# Patient Record
Sex: Female | Born: 1950
Health system: Southern US, Community
[De-identification: ages and names within clinical notes are randomized; demographics above are authoritative.]

## PROBLEM LIST (undated history)

## (undated) DIAGNOSIS — F32A Depression, unspecified: Secondary | ICD-10-CM

## (undated) DIAGNOSIS — E785 Hyperlipidemia, unspecified: Secondary | ICD-10-CM

## (undated) DIAGNOSIS — T4145XA Adverse effect of unspecified anesthetic, initial encounter: Secondary | ICD-10-CM

## (undated) DIAGNOSIS — F329 Major depressive disorder, single episode, unspecified: Secondary | ICD-10-CM

## (undated) DIAGNOSIS — T8859XA Other complications of anesthesia, initial encounter: Secondary | ICD-10-CM

## (undated) DIAGNOSIS — R112 Nausea with vomiting, unspecified: Secondary | ICD-10-CM

## (undated) DIAGNOSIS — I10 Essential (primary) hypertension: Secondary | ICD-10-CM

## (undated) DIAGNOSIS — Z9889 Other specified postprocedural states: Secondary | ICD-10-CM

## (undated) DIAGNOSIS — F419 Anxiety disorder, unspecified: Secondary | ICD-10-CM

## (undated) HISTORY — PX: BREAST BIOPSY: SHX20

## (undated) HISTORY — PX: ABDOMINAL HYSTERECTOMY: SHX81

---

## 2002-12-18 ENCOUNTER — Emergency Department (HOSPITAL_COMMUNITY): Admission: EM | Admit: 2002-12-18 | Discharge: 2002-12-18 | Payer: Self-pay | Admitting: Emergency Medicine

## 2002-12-18 ENCOUNTER — Encounter: Payer: Self-pay | Admitting: Emergency Medicine

## 2002-12-28 ENCOUNTER — Ambulatory Visit: Admission: RE | Admit: 2002-12-28 | Discharge: 2002-12-28 | Payer: Self-pay | Admitting: Gynecology

## 2002-12-30 ENCOUNTER — Encounter: Payer: Self-pay | Admitting: Gynecology

## 2003-01-04 ENCOUNTER — Inpatient Hospital Stay (HOSPITAL_COMMUNITY): Admission: RE | Admit: 2003-01-04 | Discharge: 2003-01-07 | Payer: Self-pay | Admitting: Obstetrics & Gynecology

## 2003-02-16 ENCOUNTER — Ambulatory Visit: Admission: RE | Admit: 2003-02-16 | Discharge: 2003-02-16 | Payer: Self-pay | Admitting: Gynecology

## 2003-04-04 ENCOUNTER — Other Ambulatory Visit: Admission: RE | Admit: 2003-04-04 | Discharge: 2003-04-04 | Payer: Self-pay | Admitting: Dermatology

## 2016-06-11 DIAGNOSIS — I10 Essential (primary) hypertension: Secondary | ICD-10-CM | POA: Diagnosis not present

## 2016-06-11 DIAGNOSIS — Z6837 Body mass index (BMI) 37.0-37.9, adult: Secondary | ICD-10-CM | POA: Diagnosis not present

## 2016-06-11 DIAGNOSIS — F419 Anxiety disorder, unspecified: Secondary | ICD-10-CM | POA: Diagnosis not present

## 2016-06-14 ENCOUNTER — Other Ambulatory Visit (HOSPITAL_COMMUNITY): Payer: Self-pay | Admitting: Internal Medicine

## 2016-06-14 DIAGNOSIS — Z1231 Encounter for screening mammogram for malignant neoplasm of breast: Secondary | ICD-10-CM

## 2016-06-24 ENCOUNTER — Encounter (HOSPITAL_COMMUNITY): Payer: Self-pay | Admitting: Radiology

## 2016-06-24 ENCOUNTER — Ambulatory Visit (HOSPITAL_COMMUNITY)
Admission: RE | Admit: 2016-06-24 | Discharge: 2016-06-24 | Disposition: A | Discharge status: Home / Self Care | Payer: Medicare Other | Source: Ambulatory Visit | Attending: Internal Medicine | Admitting: Internal Medicine

## 2016-06-24 DIAGNOSIS — Z1231 Encounter for screening mammogram for malignant neoplasm of breast: Secondary | ICD-10-CM | POA: Insufficient documentation

## 2016-06-25 ENCOUNTER — Other Ambulatory Visit: Payer: Self-pay | Admitting: Internal Medicine

## 2016-06-25 DIAGNOSIS — R928 Other abnormal and inconclusive findings on diagnostic imaging of breast: Secondary | ICD-10-CM

## 2016-07-09 ENCOUNTER — Ambulatory Visit (HOSPITAL_COMMUNITY)
Admission: RE | Admit: 2016-07-09 | Discharge: 2016-07-09 | Disposition: A | Discharge status: Home / Self Care | Payer: Medicare Other | Source: Ambulatory Visit | Attending: Internal Medicine | Admitting: Internal Medicine

## 2016-07-09 DIAGNOSIS — R928 Other abnormal and inconclusive findings on diagnostic imaging of breast: Secondary | ICD-10-CM

## 2016-07-09 DIAGNOSIS — N6310 Unspecified lump in the right breast, unspecified quadrant: Secondary | ICD-10-CM | POA: Diagnosis not present

## 2016-07-09 DIAGNOSIS — N6489 Other specified disorders of breast: Secondary | ICD-10-CM | POA: Diagnosis not present

## 2016-07-11 ENCOUNTER — Other Ambulatory Visit (HOSPITAL_COMMUNITY): Payer: Self-pay | Admitting: Internal Medicine

## 2016-07-11 DIAGNOSIS — N6310 Unspecified lump in the right breast, unspecified quadrant: Secondary | ICD-10-CM

## 2016-07-15 DIAGNOSIS — I10 Essential (primary) hypertension: Secondary | ICD-10-CM | POA: Diagnosis not present

## 2016-07-15 DIAGNOSIS — Z79899 Other long term (current) drug therapy: Secondary | ICD-10-CM | POA: Diagnosis not present

## 2016-07-15 DIAGNOSIS — F419 Anxiety disorder, unspecified: Secondary | ICD-10-CM | POA: Diagnosis not present

## 2016-07-22 DIAGNOSIS — R7301 Impaired fasting glucose: Secondary | ICD-10-CM | POA: Diagnosis not present

## 2016-07-22 DIAGNOSIS — Z23 Encounter for immunization: Secondary | ICD-10-CM | POA: Diagnosis not present

## 2016-07-22 DIAGNOSIS — I10 Essential (primary) hypertension: Secondary | ICD-10-CM | POA: Diagnosis not present

## 2016-07-22 DIAGNOSIS — E785 Hyperlipidemia, unspecified: Secondary | ICD-10-CM | POA: Diagnosis not present

## 2016-07-23 ENCOUNTER — Other Ambulatory Visit (HOSPITAL_COMMUNITY): Payer: Self-pay | Admitting: Internal Medicine

## 2016-07-23 ENCOUNTER — Ambulatory Visit (HOSPITAL_COMMUNITY)
Admission: RE | Admit: 2016-07-23 | Discharge: 2016-07-23 | Disposition: A | Discharge status: Home / Self Care | Payer: Medicare Other | Source: Ambulatory Visit | Attending: Internal Medicine | Admitting: Internal Medicine

## 2016-07-23 ENCOUNTER — Encounter (HOSPITAL_COMMUNITY): Payer: Self-pay

## 2016-07-23 ENCOUNTER — Ambulatory Visit (HOSPITAL_COMMUNITY): Payer: Medicare Other

## 2016-07-23 DIAGNOSIS — R920 Mammographic microcalcification found on diagnostic imaging of breast: Secondary | ICD-10-CM | POA: Insufficient documentation

## 2016-07-23 DIAGNOSIS — N6311 Unspecified lump in the right breast, upper outer quadrant: Secondary | ICD-10-CM | POA: Diagnosis not present

## 2016-07-23 DIAGNOSIS — N6310 Unspecified lump in the right breast, unspecified quadrant: Secondary | ICD-10-CM

## 2016-07-23 DIAGNOSIS — D241 Benign neoplasm of right breast: Secondary | ICD-10-CM | POA: Diagnosis not present

## 2016-07-23 MED ORDER — LIDOCAINE-EPINEPHRINE (PF) 1 %-1:200000 IJ SOLN
INTRAMUSCULAR | Status: AC
  Filled 2016-07-23: qty 30

## 2016-07-23 MED ORDER — LIDOCAINE HCL (PF) 1 % IJ SOLN
INTRAMUSCULAR | Status: AC
  Filled 2016-07-23: qty 10

## 2016-08-26 DIAGNOSIS — Z79899 Other long term (current) drug therapy: Secondary | ICD-10-CM | POA: Diagnosis not present

## 2016-08-26 DIAGNOSIS — E785 Hyperlipidemia, unspecified: Secondary | ICD-10-CM | POA: Diagnosis not present

## 2016-09-02 DIAGNOSIS — E785 Hyperlipidemia, unspecified: Secondary | ICD-10-CM | POA: Diagnosis not present

## 2016-09-02 DIAGNOSIS — I10 Essential (primary) hypertension: Secondary | ICD-10-CM | POA: Diagnosis not present

## 2016-09-02 DIAGNOSIS — Z23 Encounter for immunization: Secondary | ICD-10-CM | POA: Diagnosis not present

## 2016-09-05 ENCOUNTER — Encounter (INDEPENDENT_AMBULATORY_CARE_PROVIDER_SITE_OTHER): Payer: Self-pay | Admitting: *Deleted

## 2016-09-16 DIAGNOSIS — M6740 Ganglion, unspecified site: Secondary | ICD-10-CM | POA: Diagnosis not present

## 2016-09-16 DIAGNOSIS — M67442 Ganglion, left hand: Secondary | ICD-10-CM | POA: Diagnosis not present

## 2016-09-16 DIAGNOSIS — M19042 Primary osteoarthritis, left hand: Secondary | ICD-10-CM | POA: Diagnosis not present

## 2016-10-15 ENCOUNTER — Encounter (HOSPITAL_BASED_OUTPATIENT_CLINIC_OR_DEPARTMENT_OTHER): Payer: Self-pay | Admitting: *Deleted

## 2016-10-17 ENCOUNTER — Other Ambulatory Visit: Payer: Self-pay | Admitting: Orthopedic Surgery

## 2016-10-22 ENCOUNTER — Ambulatory Visit (HOSPITAL_BASED_OUTPATIENT_CLINIC_OR_DEPARTMENT_OTHER)
Admission: RE | Admit: 2016-10-22 | Discharge: 2016-10-22 | Disposition: A | Discharge status: Home / Self Care | Payer: Medicare Other | Source: Ambulatory Visit | Attending: Orthopedic Surgery | Admitting: Orthopedic Surgery

## 2016-10-22 ENCOUNTER — Ambulatory Visit (HOSPITAL_BASED_OUTPATIENT_CLINIC_OR_DEPARTMENT_OTHER): Payer: Medicare Other | Admitting: Anesthesiology

## 2016-10-22 ENCOUNTER — Encounter (HOSPITAL_BASED_OUTPATIENT_CLINIC_OR_DEPARTMENT_OTHER)
Admission: RE | Disposition: A | Discharge status: Home / Self Care | Payer: Self-pay | Source: Ambulatory Visit | Attending: Orthopedic Surgery

## 2016-10-22 ENCOUNTER — Encounter (HOSPITAL_BASED_OUTPATIENT_CLINIC_OR_DEPARTMENT_OTHER): Payer: Self-pay | Admitting: Anesthesiology

## 2016-10-22 DIAGNOSIS — Z79899 Other long term (current) drug therapy: Secondary | ICD-10-CM | POA: Insufficient documentation

## 2016-10-22 DIAGNOSIS — E785 Hyperlipidemia, unspecified: Secondary | ICD-10-CM | POA: Insufficient documentation

## 2016-10-22 DIAGNOSIS — I10 Essential (primary) hypertension: Secondary | ICD-10-CM | POA: Diagnosis not present

## 2016-10-22 DIAGNOSIS — M71342 Other bursal cyst, left hand: Secondary | ICD-10-CM | POA: Diagnosis not present

## 2016-10-22 DIAGNOSIS — M25842 Other specified joint disorders, left hand: Secondary | ICD-10-CM | POA: Insufficient documentation

## 2016-10-22 DIAGNOSIS — F419 Anxiety disorder, unspecified: Secondary | ICD-10-CM | POA: Diagnosis not present

## 2016-10-22 DIAGNOSIS — M19042 Primary osteoarthritis, left hand: Secondary | ICD-10-CM | POA: Diagnosis not present

## 2016-10-22 DIAGNOSIS — F329 Major depressive disorder, single episode, unspecified: Secondary | ICD-10-CM | POA: Insufficient documentation

## 2016-10-22 DIAGNOSIS — M151 Heberden's nodes (with arthropathy): Secondary | ICD-10-CM | POA: Diagnosis not present

## 2016-10-22 HISTORY — DX: Other complications of anesthesia, initial encounter: T88.59XA

## 2016-10-22 HISTORY — DX: Essential (primary) hypertension: I10

## 2016-10-22 HISTORY — PX: MASS EXCISION: SHX2000

## 2016-10-22 HISTORY — DX: Major depressive disorder, single episode, unspecified: F32.9

## 2016-10-22 HISTORY — DX: Depression, unspecified: F32.A

## 2016-10-22 HISTORY — DX: Other specified postprocedural states: Z98.890

## 2016-10-22 HISTORY — DX: Adverse effect of unspecified anesthetic, initial encounter: T41.45XA

## 2016-10-22 HISTORY — DX: Hyperlipidemia, unspecified: E78.5

## 2016-10-22 HISTORY — DX: Nausea with vomiting, unspecified: R11.2

## 2016-10-22 HISTORY — DX: Anxiety disorder, unspecified: F41.9

## 2016-10-22 SURGERY — EXCISION MASS
Anesthesia: Regional | Site: Finger | Laterality: Left

## 2016-10-22 MED ORDER — CHLORHEXIDINE GLUCONATE 4 % EX LIQD: 60.0000 mL | Freq: Once | CUTANEOUS | Status: DC

## 2016-10-22 MED ORDER — METOCLOPRAMIDE HCL 5 MG/ML IJ SOLN: 10.0000 mg | Freq: Once | INTRAMUSCULAR | Status: DC | PRN

## 2016-10-22 MED ORDER — CEFAZOLIN SODIUM-DEXTROSE 2-4 GM/100ML-% IV SOLN
INTRAVENOUS | Status: AC
  Filled 2016-10-22: qty 100

## 2016-10-22 MED ORDER — FENTANYL CITRATE (PF) 100 MCG/2ML IJ SOLN: 25.0000 ug | INTRAMUSCULAR | Status: DC | PRN

## 2016-10-22 MED ORDER — LIDOCAINE 2% (20 MG/ML) 5 ML SYRINGE
INTRAMUSCULAR | Status: AC
  Filled 2016-10-22: qty 5

## 2016-10-22 MED ORDER — LACTATED RINGERS IV SOLN: INTRAVENOUS | Status: DC

## 2016-10-22 MED ORDER — MIDAZOLAM HCL 2 MG/2ML IJ SOLN
INTRAMUSCULAR | Status: AC
  Filled 2016-10-22: qty 2

## 2016-10-22 MED ORDER — LIDOCAINE 2% (20 MG/ML) 5 ML SYRINGE
INTRAMUSCULAR | Status: DC | PRN
  Administered 2016-10-22: 10 mg via INTRAVENOUS

## 2016-10-22 MED ORDER — FENTANYL CITRATE (PF) 100 MCG/2ML IJ SOLN: 50.0000 ug | INTRAMUSCULAR | Status: DC | PRN

## 2016-10-22 MED ORDER — LACTATED RINGERS IV SOLN
INTRAVENOUS | Status: DC
  Administered 2016-10-22 (×2): via INTRAVENOUS

## 2016-10-22 MED ORDER — ONDANSETRON HCL 4 MG/2ML IJ SOLN
INTRAMUSCULAR | Status: AC
  Filled 2016-10-22: qty 2

## 2016-10-22 MED ORDER — FENTANYL CITRATE (PF) 100 MCG/2ML IJ SOLN
INTRAMUSCULAR | Status: DC | PRN
  Administered 2016-10-22: 100 ug via INTRAVENOUS

## 2016-10-22 MED ORDER — PROPOFOL 10 MG/ML IV BOLUS
INTRAVENOUS | Status: DC | PRN
  Administered 2016-10-22: 20 mg via INTRAVENOUS

## 2016-10-22 MED ORDER — BUPIVACAINE HCL (PF) 0.25 % IJ SOLN
INTRAMUSCULAR | Status: DC | PRN
  Administered 2016-10-22: 7 mL

## 2016-10-22 MED ORDER — PROPOFOL 10 MG/ML IV BOLUS
INTRAVENOUS | Status: AC
  Filled 2016-10-22: qty 20

## 2016-10-22 MED ORDER — HYDROCODONE-ACETAMINOPHEN 5-325 MG PO TABS: 1.0000 | ORAL_TABLET | Freq: Four times a day (QID) | ORAL | 0 refills | Status: DC | PRN

## 2016-10-22 MED ORDER — DEXAMETHASONE SODIUM PHOSPHATE 10 MG/ML IJ SOLN
INTRAMUSCULAR | Status: AC
  Filled 2016-10-22: qty 1

## 2016-10-22 MED ORDER — CEFAZOLIN SODIUM-DEXTROSE 2-4 GM/100ML-% IV SOLN
2.0000 g | INTRAVENOUS | Status: AC
  Administered 2016-10-22: 2 g via INTRAVENOUS

## 2016-10-22 MED ORDER — LIDOCAINE HCL (PF) 0.5 % IJ SOLN
INTRAMUSCULAR | Status: DC | PRN
  Administered 2016-10-22: 30 mL via INTRAVENOUS

## 2016-10-22 MED ORDER — SCOPOLAMINE 1 MG/3DAYS TD PT72: 1.0000 | MEDICATED_PATCH | Freq: Once | TRANSDERMAL | Status: DC | PRN

## 2016-10-22 MED ORDER — FENTANYL CITRATE (PF) 100 MCG/2ML IJ SOLN
INTRAMUSCULAR | Status: AC
  Filled 2016-10-22: qty 2

## 2016-10-22 MED ORDER — MIDAZOLAM HCL 5 MG/5ML IJ SOLN
INTRAMUSCULAR | Status: DC | PRN
  Administered 2016-10-22: 2 mg via INTRAVENOUS

## 2016-10-22 MED ORDER — MIDAZOLAM HCL 2 MG/2ML IJ SOLN: 1.0000 mg | INTRAMUSCULAR | Status: DC | PRN

## 2016-10-22 MED ORDER — MEPERIDINE HCL 25 MG/ML IJ SOLN: 6.2500 mg | INTRAMUSCULAR | Status: DC | PRN

## 2016-10-22 SURGICAL SUPPLY — 49 items
BANDAGE COBAN STERILE 2 (GAUZE/BANDAGES/DRESSINGS) IMPLANT
BLADE SURG 15 STRL LF DISP TIS (BLADE) ×1 IMPLANT
BLADE SURG 15 STRL SS (BLADE) ×2
BNDG CMPR 9X4 STRL LF SNTH (GAUZE/BANDAGES/DRESSINGS) ×1
BNDG COHESIVE 1X5 TAN STRL LF (GAUZE/BANDAGES/DRESSINGS) ×1 IMPLANT
BNDG COHESIVE 3X5 TAN STRL LF (GAUZE/BANDAGES/DRESSINGS) IMPLANT
BNDG ESMARK 4X9 LF (GAUZE/BANDAGES/DRESSINGS) ×1 IMPLANT
BNDG GAUZE ELAST 4 BULKY (GAUZE/BANDAGES/DRESSINGS) IMPLANT
CHLORAPREP W/TINT 26ML (MISCELLANEOUS) ×2 IMPLANT
CORDS BIPOLAR (ELECTRODE) ×1 IMPLANT
COVER BACK TABLE 60X90IN (DRAPES) ×2 IMPLANT
COVER MAYO STAND STRL (DRAPES) ×2 IMPLANT
CUFF TOURNIQUET SINGLE 18IN (TOURNIQUET CUFF) ×1 IMPLANT
DECANTER SPIKE VIAL GLASS SM (MISCELLANEOUS) IMPLANT
DRAIN PENROSE 1/2X12 LTX STRL (WOUND CARE) IMPLANT
DRAPE EXTREMITY T 121X128X90 (DRAPE) ×2 IMPLANT
DRAPE SURG 17X23 STRL (DRAPES) ×2 IMPLANT
GAUZE SPONGE 4X4 12PLY STRL (GAUZE/BANDAGES/DRESSINGS) ×2 IMPLANT
GAUZE XEROFORM 1X8 LF (GAUZE/BANDAGES/DRESSINGS) ×2 IMPLANT
GLOVE BIOGEL PI IND STRL 6.5 (GLOVE) IMPLANT
GLOVE BIOGEL PI IND STRL 7.0 (GLOVE) IMPLANT
GLOVE BIOGEL PI IND STRL 8.5 (GLOVE) ×1 IMPLANT
GLOVE BIOGEL PI INDICATOR 6.5 (GLOVE) ×1
GLOVE BIOGEL PI INDICATOR 7.0 (GLOVE) ×2
GLOVE BIOGEL PI INDICATOR 8.5 (GLOVE) ×1
GLOVE ECLIPSE 6.5 STRL STRAW (GLOVE) ×1 IMPLANT
GLOVE SURG ORTHO 8.0 STRL STRW (GLOVE) ×2 IMPLANT
GLOVE SURG SS PI 6.5 STRL IVOR (GLOVE) ×1 IMPLANT
GOWN STRL REUS W/ TWL LRG LVL3 (GOWN DISPOSABLE) ×1 IMPLANT
GOWN STRL REUS W/TWL LRG LVL3 (GOWN DISPOSABLE) ×2
GOWN STRL REUS W/TWL XL LVL3 (GOWN DISPOSABLE) ×2 IMPLANT
NDL PRECISIONGLIDE 27X1.5 (NEEDLE) ×1 IMPLANT
NDL SAFETY ECLIPSE 18X1.5 (NEEDLE) IMPLANT
NEEDLE HYPO 18GX1.5 SHARP (NEEDLE)
NEEDLE PRECISIONGLIDE 27X1.5 (NEEDLE) ×2 IMPLANT
NS IRRIG 1000ML POUR BTL (IV SOLUTION) ×2 IMPLANT
PACK BASIN DAY SURGERY FS (CUSTOM PROCEDURE TRAY) ×2 IMPLANT
PAD CAST 3X4 CTTN HI CHSV (CAST SUPPLIES) IMPLANT
PADDING CAST COTTON 3X4 STRL (CAST SUPPLIES)
SPLINT FINGER 4.25 BULB 911906 (SOFTGOODS) ×1 IMPLANT
SPLINT PLASTER CAST XFAST 3X15 (CAST SUPPLIES) IMPLANT
SPLINT PLASTER XTRA FASTSET 3X (CAST SUPPLIES)
STOCKINETTE 4X48 STRL (DRAPES) ×2 IMPLANT
SUT ETHILON 4 0 PS 2 18 (SUTURE) ×2 IMPLANT
SUT VIC AB 4-0 P2 18 (SUTURE) IMPLANT
SYR BULB 3OZ (MISCELLANEOUS) ×2 IMPLANT
SYR CONTROL 10ML LL (SYRINGE) ×2 IMPLANT
TOWEL OR 17X24 6PK STRL BLUE (TOWEL DISPOSABLE) ×2 IMPLANT
UNDERPAD 30X30 (UNDERPADS AND DIAPERS) ×1 IMPLANT

## 2016-10-22 NOTE — Op Note (Signed)
Dictation Number [id number]

## 2016-10-22 NOTE — Anesthesia Preprocedure Evaluation (Signed)
Anesthesia Evaluation  Patient identified by MRN, date of birth, ID band Patient awake    Reviewed: Allergy & Precautions, NPO status , Patient's Chart, lab work & pertinent test results  History of Anesthesia Complications (+) PONV  Airway Mallampati: II  TM Distance: >3 FB Neck ROM: Full    Dental no notable dental hx.    Pulmonary neg pulmonary ROS,    Pulmonary exam normal breath sounds clear to auscultation       Cardiovascular hypertension, Pt. on medications Normal cardiovascular exam Rhythm:Regular Rate:Normal     Neuro/Psych negative neurological ROS  negative psych ROS   GI/Hepatic negative GI ROS, Neg liver ROS,   Endo/Other  negative endocrine ROS  Renal/GU negative Renal ROS  negative genitourinary   Musculoskeletal negative musculoskeletal ROS (+)   Abdominal   Peds negative pediatric ROS (+)  Hematology negative hematology ROS (+)   Anesthesia Other Findings   Reproductive/Obstetrics negative OB ROS                            Anesthesia Physical Anesthesia Plan  ASA: II  Anesthesia Plan: Bier Block   Post-op Pain Management:    Induction:   Airway Management Planned: Simple Face Mask  Additional Equipment:   Intra-op Plan:   Post-operative Plan:   Informed Consent: I have reviewed the patients History and Physical, chart, labs and discussed the procedure including the risks, benefits and alternatives for the proposed anesthesia with the patient or authorized representative who has indicated his/her understanding and acceptance.   Dental advisory given  Plan Discussed with:   Anesthesia Plan Comments:         Anesthesia Quick Evaluation

## 2016-10-22 NOTE — Discharge Instructions (Addendum)

## 2016-10-22 NOTE — Anesthesia Postprocedure Evaluation (Signed)
Anesthesia Post Note  Patient: Allison Rowland  Procedure(s) Performed: Procedure(s) (LRB): EXCISION CYST AND DEBRIDEMENT OF DISTAL INTERPHALANGEAL JOINT LEFT MIDDLE FINGER (Left)  Patient location during evaluation: PACU Anesthesia Type: Bier Block Level of consciousness: awake and alert Pain management: pain level controlled Vital Signs Assessment: post-procedure vital signs reviewed and stable Respiratory status: spontaneous breathing, nonlabored ventilation, respiratory function stable and patient connected to nasal cannula oxygen Cardiovascular status: stable and blood pressure returned to baseline Anesthetic complications: no       Last Vitals:  Vitals:   10/22/16 1015 10/22/16 1045  BP: (!) 109/58 140/68  Pulse: 71 66  Resp: (!) 25   Temp:  36.4 C    Last Pain:  Vitals:   10/22/16 1045  TempSrc: Oral  PainSc: 0-No pain                 Montez Hageman

## 2016-10-22 NOTE — H&P (Signed)
Allison Rowland is an 66 y.o. female.   Chief Complaint: mass left middle finger HPI: Allison Rowland is a 66 year old right-hand-dominant female referred by Dr. Willey Blade consultation regarding a mass on her left middle finger this grooving the nail plate distally. She states it is been present for approximately 6 months. She recalls that not being open. She recalls no history of injury. She is not complaining of any significant pain or discomfort with it. Has no history diabetes thyroid problems arthritis gout. Family history is negative for each of these also.      Past Medical History:  Diagnosis Date  . Anxiety   . Complication of anesthesia   . Depression   . Hyperlipidemia   . Hypertension   . PONV (postoperative nausea and vomiting)     Past Surgical History:  Procedure Laterality Date  . ABDOMINAL HYSTERECTOMY      History reviewed. No pertinent family history. Social History:  reports that she has never smoked. She has never used smokeless tobacco. She reports that she drinks alcohol. She reports that she does not use drugs.  Allergies: No Known Allergies  No prescriptions prior to admission.    No results found for this or any previous visit (from the past 48 hour(s)).  No results found.   Pertinent items are noted in HPI.  Height 5\' 6"  (1.676 m), weight 90.7 kg (200 lb).  General appearance: alert, cooperative and appears stated age Head: Normocephalic, without obvious abnormality Neck: no JVD Resp: clear to auscultation bilaterally Cardio: regular rate and rhythm, S1, S2 normal, no murmur, click, rub or gallop GI: soft, non-tender; bowel sounds normal; no masses,  no organomegaly Extremities: mass left middle finger Pulses: 2+ and symmetric Skin: Skin color, texture, turgor normal. No rashes or lesions Neurologic: Grossly normal Incision/Wound: na  Assessment/Plan Assessment:  1. Mucoid cyst of joint  UNILATERAL Left (middle) 2. Osteoarthritis of finger of left  hand    Plan: Discussed the etiology of this with her. We recommend excision of the cyst debridement of the joint. She is aware that there is no guarantee to the surgery the possibility of infection recurrence injury to arteries nerves tendons complete relief symptoms dystrophy. She would like to proceed. Questions are encouraged and answered her satisfaction. She is scheduled for excision of mucoid cyst debridement distal interphalangeal joint left middle finger as an outpatient under regional anesthesia.      Flor Houdeshell R 10/22/2016, 6:09 AM

## 2016-10-22 NOTE — Anesthesia Procedure Notes (Signed)
Anesthesia Regional Block: Bier block (IV Regional)   Pre-Anesthetic Checklist: ,, timeout performed, Correct Patient, Correct Site, Correct Laterality, Correct Procedure, Correct Position, site marked, Risks and benefits discussed,  Surgical consent,  Pre-op evaluation,  At surgeon's request and post-op pain management  Laterality: Left      Narrative:  Start time: 10/22/2016 9:43 AM End time: 10/22/2016 9:44 AM

## 2016-10-22 NOTE — Transfer of Care (Signed)
Immediate Anesthesia Transfer of Care Note  Patient: Allison Rowland  Procedure(s) Performed: Procedure(s): EXCISION CYST AND DEBRIDEMENT OF DISTAL INTERPHALANGEAL JOINT LEFT MIDDLE FINGER (Left)  Patient Location: PACU  Anesthesia Type:Bier block  Level of Consciousness: awake and patient cooperative  Airway & Oxygen Therapy: Patient Spontanous Breathing and Patient connected to face mask oxygen  Post-op Assessment: Report given to RN and Post -op Vital signs reviewed and stable  Post vital signs: Reviewed and stable  Last Vitals:  Vitals:   10/22/16 0911  BP: (!) 135/102  Pulse: (!) 59  Resp: 18  Temp: 36.6 C    Last Pain:  Vitals:   10/22/16 0911  TempSrc: Oral         Complications: No apparent anesthesia complications

## 2016-10-22 NOTE — Op Note (Signed)
NAMEMARITSA, Allison Rowland                ACCOUNT NO.:  [id number]  MEDICAL RECORD NO.:  [id number]  LOCATION:                                 FACILITY:  PHYSICIAN:  Daryll Brod, M.D.            DATE OF BIRTH:  DATE OF PROCEDURE:  10/22/2016 DATE OF DISCHARGE:                              OPERATIVE REPORT   PREOPERATIVE DIAGNOSES:  Mucoid cyst with degenerative arthritis in distal interphalangeal joint of left middle finger.  POSTOPERATIVE DIAGNOSES:  Mucoid cyst with degenerative arthritis in distal interphalangeal joint of left middle finger.  OPERATION:  Debridement of left middle finger distal interphalangeal joint and excision of cyst.  SURGEON:  Daryll Brod, M.D.  ASSISTANT:  None.  ANESTHESIA:  Forearm-based IV regional with local infiltration and with IV sedation.  PLACE OF SURGERY:  [facility].  ANESTHESIOLOGIST:  Montez Hageman, MD.  HISTORY:  The patient is a 66 year old female with a history of a mass on the dorsal aspect of her left middle finger.  She has elected to undergo surgical excision with debridement of the distal interphalangeal joint.  Pre, peri, and postoperative course have been discussed along with risks and complications.  She is aware that there is no guarantee to the surgery, the possibility of infection; recurrence of injury to arteries, nerves, tendons; incomplete relief of symptoms and dystrophy. In the preoperative area, the patient was seen, the extremity marked by both patient and surgeon.  Antibiotics given.  PROCEDURE IN DETAIL:  The patient was brought to the operating room, where a forearm-based IV regional anesthetic was carried out without difficulty.  She was prepped using ChloraPrep in a supine position with the left arm free.  A 3-minute dry time was allowed and time-out taken, confirming the patient and procedure.  A hockey-stick incision was made over the distal interphalangeal joint, carried down to the middle lateral  line of the left middle finger.  This was carried down through subcutaneous tissue.  The dissection was carried distally beneath the skin to the area of the cyst.  This area was debrided with a curette and hemostatic rongeur.  The distal interphalangeal joint was opened.  The osteophytes were removed from the middle phalanx.  A dorsal synovectomy was performed.  This was done with the hemostatic rongeur and curettes. The wound was copiously irrigated with saline.  The specimen was sent to Pathology.  The wound was then closed with interrupted 4-0 nylon sutures.  A digital block was given at the beginning of the procedure using 0.25% bupivacaine without epinephrine, and 8 mL was used.  A sterile compressive dressing and splint to the finger was applied.  On deflation of the tourniquet, all fingers are pink.  She was taken to the recovery room for observation in satisfactory condition. She will be discharged to home to return to the [facility] in 1 week, on [facility].          ______________________________ Daryll Brod, M.D.     GK/MEDQ  D:  10/22/2016  T:  10/22/2016  Job:  [id number]

## 2016-10-22 NOTE — Brief Op Note (Signed)
10/22/2016  10:12 AM  PATIENT:  Allison Rowland  66 y.o. female  PRE-OPERATIVE DIAGNOSIS:  DEGENERATIVE JOINT DISEASE DISTAL INTERPHALANGEAL JOINT LEFT MIDDLE FINGER  POST-OPERATIVE DIAGNOSIS:  DEGENERATIVE JOINT DISEASE DISTAL INTERPHALANGEAL JOINT LEFT MIDDLE FINGER  PROCEDURE:  Procedure(s): EXCISION CYST AND DEBRIDEMENT OF DISTAL INTERPHALANGEAL JOINT LEFT MIDDLE FINGER (Left)  SURGEON:  Surgeon(s) and Role:    * Daryll Brod, MD - Primary  PHYSICIAN ASSISTANT:   ASSISTANTS: none   ANESTHESIA:   local, regional and IV sedation  EBL:  Total I/O In: -  Out: 2 [Blood:2]  BLOOD ADMINISTERED:none  DRAINS: none   LOCAL MEDICATIONS USED:  BUPIVICAINE   SPECIMEN:  Excision  DISPOSITION OF SPECIMEN:  PATHOLOGY  COUNTS:  YES  TOURNIQUET:   Total Tourniquet Time Documented: Forearm (Left) - 21 minutes Total: Forearm (Left) - 21 minutes   DICTATION: .Other Dictation: Dictation Number [id number]  PLAN OF CARE: Discharge to home after PACU  PATIENT DISPOSITION:  PACU - hemodynamically stable.

## 2016-10-23 ENCOUNTER — Encounter (HOSPITAL_BASED_OUTPATIENT_CLINIC_OR_DEPARTMENT_OTHER): Payer: Self-pay | Admitting: Orthopedic Surgery

## 2016-12-02 DIAGNOSIS — I10 Essential (primary) hypertension: Secondary | ICD-10-CM | POA: Diagnosis not present

## 2016-12-02 DIAGNOSIS — F411 Generalized anxiety disorder: Secondary | ICD-10-CM | POA: Diagnosis not present

## 2016-12-12 ENCOUNTER — Encounter (INDEPENDENT_AMBULATORY_CARE_PROVIDER_SITE_OTHER): Payer: Self-pay | Admitting: *Deleted

## 2016-12-12 ENCOUNTER — Other Ambulatory Visit (INDEPENDENT_AMBULATORY_CARE_PROVIDER_SITE_OTHER): Payer: Self-pay | Admitting: *Deleted

## 2016-12-12 DIAGNOSIS — Z1211 Encounter for screening for malignant neoplasm of colon: Secondary | ICD-10-CM

## 2017-03-04 ENCOUNTER — Telehealth (INDEPENDENT_AMBULATORY_CARE_PROVIDER_SITE_OTHER): Payer: Self-pay | Admitting: *Deleted

## 2017-03-04 ENCOUNTER — Encounter (INDEPENDENT_AMBULATORY_CARE_PROVIDER_SITE_OTHER): Payer: Self-pay | Admitting: *Deleted

## 2017-03-04 MED ORDER — PEG 3350-KCL-NA BICARB-NACL 420 G PO SOLR
4000.0000 mL | Freq: Once | ORAL | 0 refills | Status: AC
Start: 2017-03-04 — End: 2017-03-04

## 2017-03-04 NOTE — Telephone Encounter (Signed)
Patient needs trilyte 

## 2017-03-08 DIAGNOSIS — Z23 Encounter for immunization: Secondary | ICD-10-CM | POA: Diagnosis not present

## 2017-03-18 ENCOUNTER — Telehealth (INDEPENDENT_AMBULATORY_CARE_PROVIDER_SITE_OTHER): Payer: Self-pay | Admitting: *Deleted

## 2017-03-18 NOTE — Telephone Encounter (Signed)
Referring MD/PCP: fagan   Procedure: tcs  Reason/Indication:  screening  Has patient had this procedure before?  no  If so, when, by whom and where?    Is there a family history of colon cancer?    Who?  What age when diagnosed?    Is patient diabetic?   no      Does patient have prosthetic heart valve or mechanical valve?  no  Do you have a pacemaker?  no  Has patient ever had endocarditis? no  Has patient had joint replacement within last 12 months?  no  Is patient constipated or take laxatives? no  Does patient have a history of alcohol/drug use?  no  Is patient on Coumadin, Plavix and/or Aspirin? no  Medications: atorvastatin 20 mg daily, losartan 100 mg daily, citalopram 20 mg daily, trazadone 50 mg 1/2 tab prn  Allergies: nkda  Medication Adjustment per Dr Laural Golden:   Procedure date & time: 04/10/17 at 1030

## 2017-03-18 NOTE — Telephone Encounter (Signed)
agree

## 2017-03-31 DIAGNOSIS — I10 Essential (primary) hypertension: Secondary | ICD-10-CM | POA: Diagnosis not present

## 2017-03-31 DIAGNOSIS — F411 Generalized anxiety disorder: Secondary | ICD-10-CM | POA: Diagnosis not present

## 2017-04-10 ENCOUNTER — Encounter (HOSPITAL_COMMUNITY): Payer: Self-pay

## 2017-04-10 ENCOUNTER — Ambulatory Visit (HOSPITAL_COMMUNITY)
Admission: RE | Admit: 2017-04-10 | Discharge: 2017-04-10 | Disposition: A | Discharge status: Home Health | Payer: Medicare Other | Source: Ambulatory Visit | Attending: Internal Medicine | Admitting: Internal Medicine

## 2017-04-10 ENCOUNTER — Other Ambulatory Visit: Payer: Self-pay

## 2017-04-10 ENCOUNTER — Encounter (HOSPITAL_COMMUNITY)
Admission: RE | Disposition: A | Discharge status: Home Health | Payer: Self-pay | Source: Ambulatory Visit | Attending: Internal Medicine

## 2017-04-10 DIAGNOSIS — Z1211 Encounter for screening for malignant neoplasm of colon: Secondary | ICD-10-CM | POA: Diagnosis not present

## 2017-04-10 DIAGNOSIS — K635 Polyp of colon: Secondary | ICD-10-CM | POA: Insufficient documentation

## 2017-04-10 DIAGNOSIS — E785 Hyperlipidemia, unspecified: Secondary | ICD-10-CM | POA: Diagnosis not present

## 2017-04-10 DIAGNOSIS — Z79899 Other long term (current) drug therapy: Secondary | ICD-10-CM | POA: Insufficient documentation

## 2017-04-10 DIAGNOSIS — F329 Major depressive disorder, single episode, unspecified: Secondary | ICD-10-CM | POA: Diagnosis not present

## 2017-04-10 DIAGNOSIS — D122 Benign neoplasm of ascending colon: Secondary | ICD-10-CM | POA: Diagnosis not present

## 2017-04-10 DIAGNOSIS — D123 Benign neoplasm of transverse colon: Secondary | ICD-10-CM | POA: Diagnosis not present

## 2017-04-10 DIAGNOSIS — Z90710 Acquired absence of both cervix and uterus: Secondary | ICD-10-CM | POA: Insufficient documentation

## 2017-04-10 DIAGNOSIS — D120 Benign neoplasm of cecum: Secondary | ICD-10-CM | POA: Insufficient documentation

## 2017-04-10 DIAGNOSIS — F419 Anxiety disorder, unspecified: Secondary | ICD-10-CM | POA: Insufficient documentation

## 2017-04-10 DIAGNOSIS — K5520 Angiodysplasia of colon without hemorrhage: Secondary | ICD-10-CM | POA: Diagnosis not present

## 2017-04-10 DIAGNOSIS — I10 Essential (primary) hypertension: Secondary | ICD-10-CM | POA: Diagnosis not present

## 2017-04-10 DIAGNOSIS — Q438 Other specified congenital malformations of intestine: Secondary | ICD-10-CM | POA: Diagnosis not present

## 2017-04-10 HISTORY — PX: COLONOSCOPY: SHX5424

## 2017-04-10 SURGERY — COLONOSCOPY
Anesthesia: Moderate Sedation

## 2017-04-10 MED ORDER — MEPERIDINE HCL 50 MG/ML IJ SOLN
INTRAMUSCULAR | Status: DC | PRN
  Administered 2017-04-10 (×2): 25 mg via INTRAVENOUS

## 2017-04-10 MED ORDER — STERILE WATER FOR IRRIGATION IR SOLN
Status: DC | PRN
  Administered 2017-04-10: 100 mL

## 2017-04-10 MED ORDER — MIDAZOLAM HCL 5 MG/5ML IJ SOLN
INTRAMUSCULAR | Status: DC | PRN
Start: 2017-04-10 — End: 2017-04-10
  Administered 2017-04-10: 2 mg via INTRAVENOUS
  Administered 2017-04-10: 1 mg via INTRAVENOUS
  Administered 2017-04-10: 2 mg via INTRAVENOUS
  Administered 2017-04-10: 1 mg via INTRAVENOUS
  Administered 2017-04-10: 2 mg via INTRAVENOUS

## 2017-04-10 MED ORDER — MEPERIDINE HCL 50 MG/ML IJ SOLN
INTRAMUSCULAR | Status: AC
  Filled 2017-04-10: qty 1

## 2017-04-10 MED ORDER — SODIUM CHLORIDE 0.9 % IV SOLN
INTRAVENOUS | Status: DC
  Administered 2017-04-10: 20 mL/h via INTRAVENOUS

## 2017-04-10 MED ORDER — MIDAZOLAM HCL 5 MG/5ML IJ SOLN
INTRAMUSCULAR | Status: AC
  Filled 2017-04-10: qty 10

## 2017-04-10 NOTE — Discharge Instructions (Signed)
No aspirin or NSAIDs for 24 hours. Resume usual medications and diet. No driving for 24 hours. Physician will call with biopsy results.   Colonoscopy, Adult, Care After This sheet gives you information about how to care for yourself after your procedure. Your health care provider may also give you more specific instructions. If you have problems or questions, contact your health care provider. What can I expect after the procedure? After the procedure, it is common to have:  A small amount of blood in your stool for 24 hours after the procedure.  Some gas.  Mild abdominal cramping or bloating.  Follow these instructions at home: General instructions   For the first 24 hours after the procedure: ? Do not drive or use machinery. ? Do not sign important documents. ? Do not drink alcohol. ? Do your regular daily activities at a slower pace than normal. ? Eat soft, easy-to-digest foods. ? Rest often.  Take over-the-counter or prescription medicines only as told by your health care provider.  It is up to you to get the results of your procedure. Ask your health care provider, or the department performing the procedure, when your results will be ready. Relieving cramping and bloating  Try walking around when you have cramps or feel bloated.  Apply heat to your abdomen as told by your health care provider. Use a heat source that your health care provider recommends, such as a moist heat pack or a heating pad. ? Place a towel between your skin and the heat source. ? Leave the heat on for 20-30 minutes. ? Remove the heat if your skin turns bright red. This is especially important if you are unable to feel pain, heat, or cold. You may have a greater risk of getting burned. Eating and drinking  Drink enough fluid to keep your urine clear or pale yellow.  Resume your normal diet as instructed by your health care provider. Avoid heavy or fried foods that are hard to digest.  Avoid  drinking alcohol for as long as instructed by your health care provider. Contact a health care provider if:  You have blood in your stool 2-3 days after the procedure. Get help right away if:  You have more than a small spotting of blood in your stool.  You pass large blood clots in your stool.  Your abdomen is swollen.  You have nausea or vomiting.  You have a fever.  You have increasing abdominal pain that is not relieved with medicine. This information is not intended to replace advice given to you by your health care provider. Make sure you discuss any questions you have with your health care provider.     Colon Polyps Polyps are tissue growths inside the body. Polyps can grow in many places, including the large intestine (colon). A polyp may be a round bump or a mushroom-shaped growth. You could have one polyp or several. Most colon polyps are noncancerous (benign). However, some colon polyps can become cancerous over time. What are the causes? The exact cause of colon polyps is not known. What increases the risk? This condition is more likely to develop in people who:  Have a family history of colon cancer or colon polyps.  Are older than 80 or older than 45 if they are African American.  Have inflammatory bowel disease, such as ulcerative colitis or Crohn disease.  Are overweight.  Smoke cigarettes.  Do not get enough exercise.  Drink too much alcohol.  Eat a diet  that is: ? High in fat and red meat. ? Low in fiber.  Had childhood cancer that was treated with abdominal radiation.  What are the signs or symptoms? Most polyps do not cause symptoms. If you have symptoms, they may include:  Blood coming from your rectum when having a bowel movement.  Blood in your stool.The stool may look dark red or black.  A change in bowel habits, such as constipation or diarrhea.  How is this diagnosed? This condition is diagnosed with a colonoscopy. This is a  procedure that uses a lighted, flexible scope to look at the inside of your colon. How is this treated? Treatment for this condition involves removing any polyps that are found. Those polyps will then be tested for cancer. If cancer is found, your health care provider will talk to you about options for colon cancer treatment. Follow these instructions at home: Diet  Eat plenty of fiber, such as fruits, vegetables, and whole grains.  Eat foods that are high in calcium and vitamin D, such as milk, cheese, yogurt, eggs, liver, fish, and broccoli.  Limit foods high in fat, red meats, and processed meats, such as hot dogs, sausage, bacon, and lunch meats.  Maintain a healthy weight, or lose weight if recommended by your health care provider. General instructions  Do not smoke cigarettes.  Do not drink alcohol excessively.  Keep all follow-up visits as told by your health care provider. This is important. This includes keeping regularly scheduled colonoscopies. Talk to your health care provider about when you need a colonoscopy.  Exercise every day or as told by your health care provider. Contact a health care provider if:  You have new or worsening bleeding during a bowel movement.  You have new or increased blood in your stool.  You have a change in bowel habits.  You unexpectedly lose weight. This information is not intended to replace advice given to you by your health care provider. Make sure you discuss any questions you have with your health care provider.

## 2017-04-10 NOTE — H&P (Signed)
Allison Rowland is an 66 y.o. female.   Chief Complaint: Patient is here for colonoscopy. HPI: Patient is 66 year old Caucasian female who is here for screening colonoscopy.  He denies abdominal pain change in bowel habits or rectal bleeding.  This is patient's first exam.   Family history is negative for CRC.   Past Medical History:  Diagnosis Date  . Anxiety   . Complication of anesthesia   . Depression   . Hyperlipidemia   . Hypertension   . PONV (postoperative nausea and vomiting)     Past Surgical History:  Procedure Laterality Date  . ABDOMINAL HYSTERECTOMY      Family History  Problem Relation Age of Onset  . Stroke Mother   . Other Father   . Prostate cancer Father    Social History:  reports that  has never smoked. she has never used smokeless tobacco. She reports that she drinks alcohol. She reports that she does not use drugs.  Allergies: No Known Allergies  Medications Prior to Admission  Medication Sig Dispense Refill  . atorvastatin (LIPITOR) 20 MG tablet Take 20 mg by mouth daily.    . cholecalciferol (VITAMIN D) 1000 units tablet Take 1,000 Units by mouth daily.    . citalopram (CELEXA) 20 MG tablet Take 20 mg by mouth daily.    . diphenhydramine-acetaminophen (TYLENOL PM) 25-500 MG TABS tablet Take 1 tablet by mouth at bedtime as needed (sleep).    . losartan (COZAAR) 100 MG tablet Take 100 mg by mouth daily.    . Multiple Vitamin (MULTIVITAMIN WITH MINERALS) TABS tablet Take 1 tablet by mouth daily.    . Omega-3 Fatty Acids (FISH OIL) 1000 MG CAPS Take 1,000 mg by mouth daily.    . traZODone (DESYREL) 50 MG tablet Take 25-50 mg by mouth at bedtime as needed for sleep.    . vitamin C (ASCORBIC ACID) 500 MG tablet Take 500 mg by mouth daily.    Marland Kitchen HYDROcodone-acetaminophen (NORCO) 5-325 MG tablet Take 1 tablet by mouth every 6 (six) hours as needed for moderate pain. (Patient not taking: Reported on 04/02/2017) 20 tablet 0    No results found for this or any  previous visit (from the past 48 hour(s)). No results found.  ROS  Blood pressure (!) 165/79, pulse 68, temperature 97.9 F (36.6 C), resp. rate 17, height 5\' 6"  (1.676 m), weight 215 lb (97.5 kg), SpO2 99 %. Physical Exam  Constitutional: She appears well-developed and well-nourished.  HENT:  Mouth/Throat: Oropharynx is clear and moist.  Eyes: Conjunctivae are normal. No scleral icterus.  Neck: No thyromegaly present.  Cardiovascular: Normal rate, regular rhythm and normal heart sounds.  No murmur heard. Respiratory: Effort normal and breath sounds normal.  GI:  Abdomen is full with lower midline scar. Abdomen is soft and nontender without organomegaly or masses.  Musculoskeletal: She exhibits no edema.  Lymphadenopathy:    She has no cervical adenopathy.  Neurological: She is alert.  Skin: Skin is warm and dry.     Assessment/Plan Average risk screening colonoscopy.  Hildred Laser, MD 04/10/2017, 10:25 AM

## 2017-04-10 NOTE — Op Note (Signed)
[facility] Patient Name: Allison Rowland Procedure Date: 04/10/2017 10:11 AM MRN: [id number] Date of Birth: 1950/09/11 Attending MD: Hildred Laser , MD CSN: [id number] Age: 66 Admit Type: Outpatient Procedure:                Colonoscopy Indications:              Screening for colorectal malignant neoplasm Providers:                Hildred Laser, MD, Janeece Riggers, RN, Rosina Lowenstein, RN Referring MD:             Asencion Noble, MD Medicines:                Meperidine 50 mg IV, Midazolam 8 mg IV Complications:            No immediate complications. Estimated Blood Loss:     Estimated blood loss was minimal. Procedure:                Pre-Anesthesia Assessment:                           - Prior to the procedure, a History and Physical                            was performed, and patient medications and                            allergies were reviewed. The patient's tolerance of                            previous anesthesia was also reviewed. The risks                            and benefits of the procedure and the sedation                            options and risks were discussed with the patient.                            All questions were answered, and informed consent                            was obtained. Prior Anticoagulants: The patient has                            taken no previous anticoagulant or antiplatelet                            agents. ASA Grade Assessment: II - A patient with                            mild systemic disease. After reviewing the risks                            and benefits, the patient was deemed in  satisfactory condition to undergo the procedure.                           After obtaining informed consent, the colonoscope                            was passed under direct vision. Throughout the                            procedure, the patient's blood pressure, pulse, and                            oxygen saturations  were monitored continuously. The                            [id number] ([id number]) scope was introduced through                            the anus and advanced to the the cecum, identified                            by appendiceal orifice and ileocecal valve. The                            colonoscopy was somewhat difficult due to                            restricted mobility of the colon. Successful                            completion of the procedure was aided by changing                            the patient to a supine position, using manual                            pressure and withdrawing and reinserting the scope.                            The patient tolerated the procedure well. The                            quality of the bowel preparation was excellent. The                            ileocecal valve, appendiceal orifice, and rectum                            were photographed. Scope In: 10:34:16 AM Scope Out: 11:01:10 AM Scope Withdrawal Time: 0 hours 12 minutes 11 seconds  Total Procedure Duration: 0 hours 26 minutes 54 seconds  Findings:      The perianal and digital rectal examinations were normal.      Two sessile polyps were found in the hepatic flexure  and cecum. The       polyps were 4 to 5 mm in size. These polyps were removed with a cold       snare. Resection and retrieval were complete. The pathology specimen was       placed into Bottle Number 1.      Two sessile polyps were found in the transverse colon. The polyps were       small in size. These were biopsied with a cold forceps for histology.       The pathology specimen was placed into Bottle Number 1.      The retroflexed view of the distal rectum and anal verge was normal and       showed no anal or rectal abnormalities.      A single small angiodysplastic lesion without bleeding was found in the       cecum. Impression:               - Single small cecal AVM .                           - Two 4 to 5  mm polyps at the hepatic flexure and                            in the cecum, removed with a cold snare. Resected                            and retrieved.                           - Two small polyps in the transverse colon.                            Biopsied. Moderate Sedation:      Moderate (conscious) sedation was administered by the endoscopy nurse       and supervised by the endoscopist. The following parameters were       monitored: oxygen saturation, heart rate, blood pressure, CO2       capnography and response to care. Total physician intraservice time was       32 minutes. Recommendation:           - Patient has a contact number available for                            emergencies. The signs and symptoms of potential                            delayed complications were discussed with the                            patient. Return to normal activities tomorrow.                            Written discharge instructions were provided to the                            patient.                           -  Resume previous diet today.                           - Continue present medications.                           - No aspirin, ibuprofen, naproxen, or other                            non-steroidal anti-inflammatory drugs for 1 day.                           - Repeat colonoscopy in 5 years for surveillance. Procedure Code(s):        --- Professional ---                           [phone], Colonoscopy, flexible; with removal of                            tumor(s), polyp(s), or other lesion(s) by snare                            technique                           45380, 59, Colonoscopy, flexible; with biopsy,                            single or multiple                           99152, Moderate sedation services provided by the                            same physician or other qualified health care                            professional performing the diagnostic or                             therapeutic service that the sedation supports,                            requiring the presence of an independent trained                            observer to assist in the monitoring of the                            patient's level of consciousness and physiological                            status; initial 15 minutes of intraservice time,                            patient age  5 years or older                           [phone], Moderate sedation services; each additional                            15 minutes intraservice time Diagnosis Code(s):        --- Professional ---                           Z12.11, Encounter for screening for malignant                            neoplasm of colon                           D12.0, Benign neoplasm of cecum                           D12.3, Benign neoplasm of transverse colon (hepatic                            flexure or splenic flexure) CPT copyright 2016 [employer]. All rights reserved. The codes documented in this report are preliminary and upon coder review may  be revised to meet current compliance requirements. Hildred Laser, MD Hildred Laser, MD 04/10/2017 11:10:49 AM This report has been signed electronically. Number of Addenda: 0

## 2017-04-15 ENCOUNTER — Encounter (HOSPITAL_COMMUNITY): Payer: Self-pay | Admitting: Internal Medicine

## 2017-07-02 ENCOUNTER — Other Ambulatory Visit (HOSPITAL_COMMUNITY): Payer: Self-pay | Admitting: Internal Medicine

## 2017-07-02 DIAGNOSIS — Z1231 Encounter for screening mammogram for malignant neoplasm of breast: Secondary | ICD-10-CM

## 2017-07-14 ENCOUNTER — Ambulatory Visit (HOSPITAL_COMMUNITY)
Admission: RE | Admit: 2017-07-14 | Discharge: 2017-07-14 | Disposition: A | Discharge status: Home / Self Care | Payer: Medicare Other | Source: Ambulatory Visit | Attending: Internal Medicine | Admitting: Internal Medicine

## 2017-07-14 ENCOUNTER — Encounter (HOSPITAL_COMMUNITY): Payer: Self-pay

## 2017-07-14 DIAGNOSIS — Z1231 Encounter for screening mammogram for malignant neoplasm of breast: Secondary | ICD-10-CM | POA: Diagnosis not present

## 2017-08-11 DIAGNOSIS — Z79899 Other long term (current) drug therapy: Secondary | ICD-10-CM | POA: Diagnosis not present

## 2017-08-11 DIAGNOSIS — I10 Essential (primary) hypertension: Secondary | ICD-10-CM | POA: Diagnosis not present

## 2017-08-11 DIAGNOSIS — F419 Anxiety disorder, unspecified: Secondary | ICD-10-CM | POA: Diagnosis not present

## 2017-08-18 DIAGNOSIS — Z23 Encounter for immunization: Secondary | ICD-10-CM | POA: Diagnosis not present

## 2017-08-18 DIAGNOSIS — I10 Essential (primary) hypertension: Secondary | ICD-10-CM | POA: Diagnosis not present

## 2017-08-18 DIAGNOSIS — E785 Hyperlipidemia, unspecified: Secondary | ICD-10-CM | POA: Diagnosis not present

## 2017-09-01 IMAGING — MG 2D DIGITAL DIAGNOSTIC UNILATERAL RIGHT MAMMOGRAM WITH CAD AND AD
6 series · 6 of 18 positions shown · non-contrast
Comparison: Screening mammogram dated 06/24/2016.

CLINICAL DATA: Screening recall for possible right breast mass.

EXAM:
2D DIGITAL DIAGNOSTIC UNILATERAL RIGHT MAMMOGRAM WITH CAD AND
ADJUNCT TOMO
RIGHT BREAST ULTRASOUND

[R CC (1 of 2)]
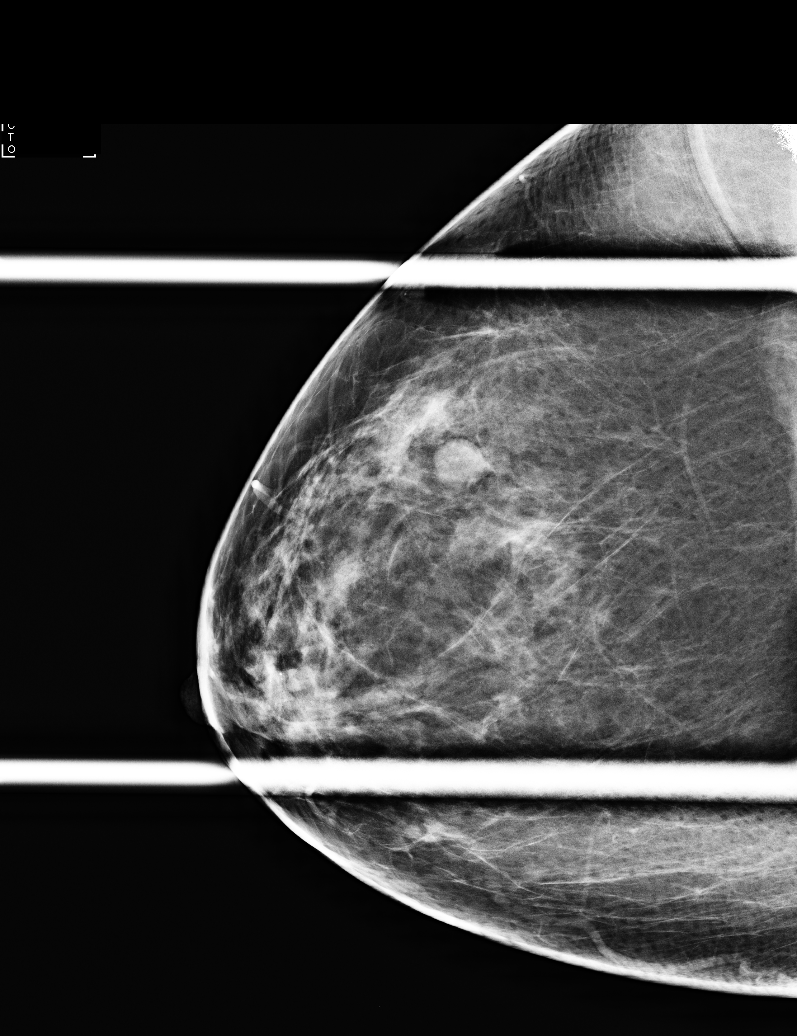

[R MLO]
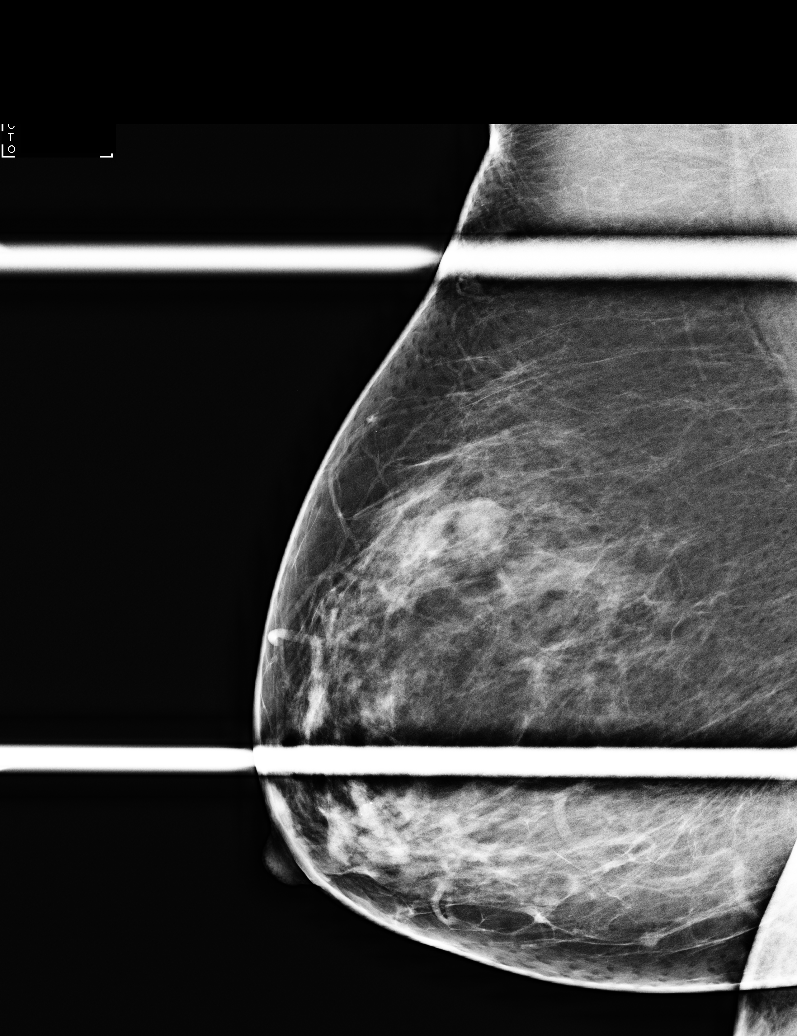

[R CC (2 of 2)]
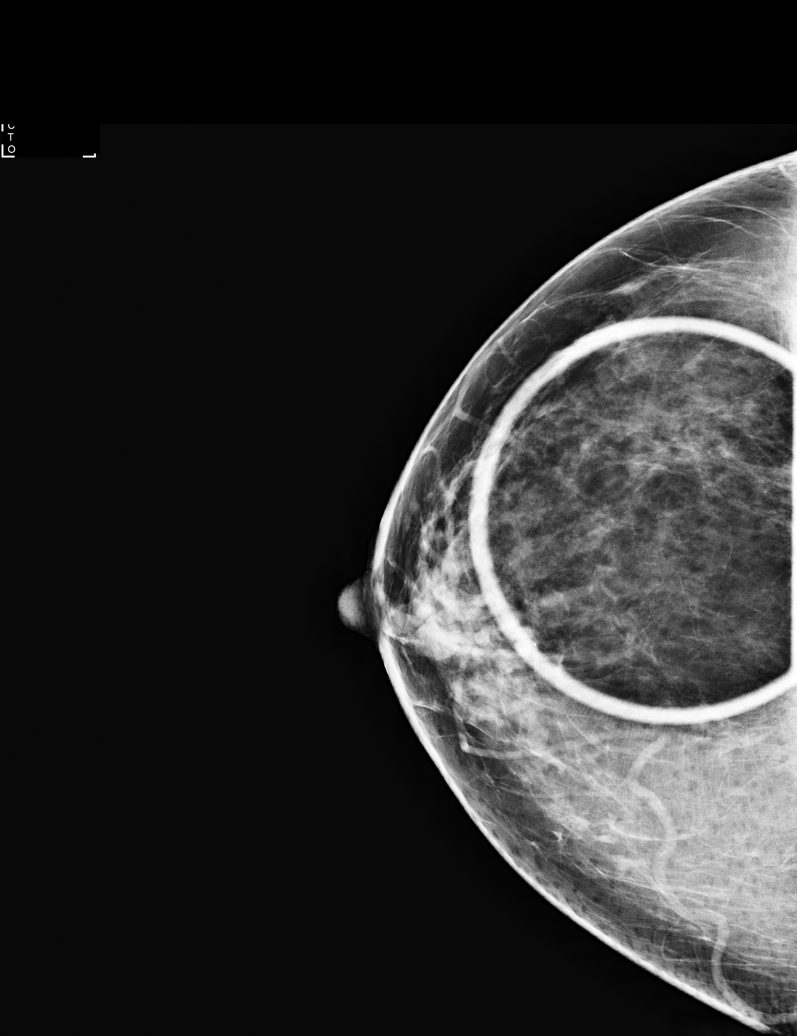

[R MLO tomo · tomo slice 29/57.0]
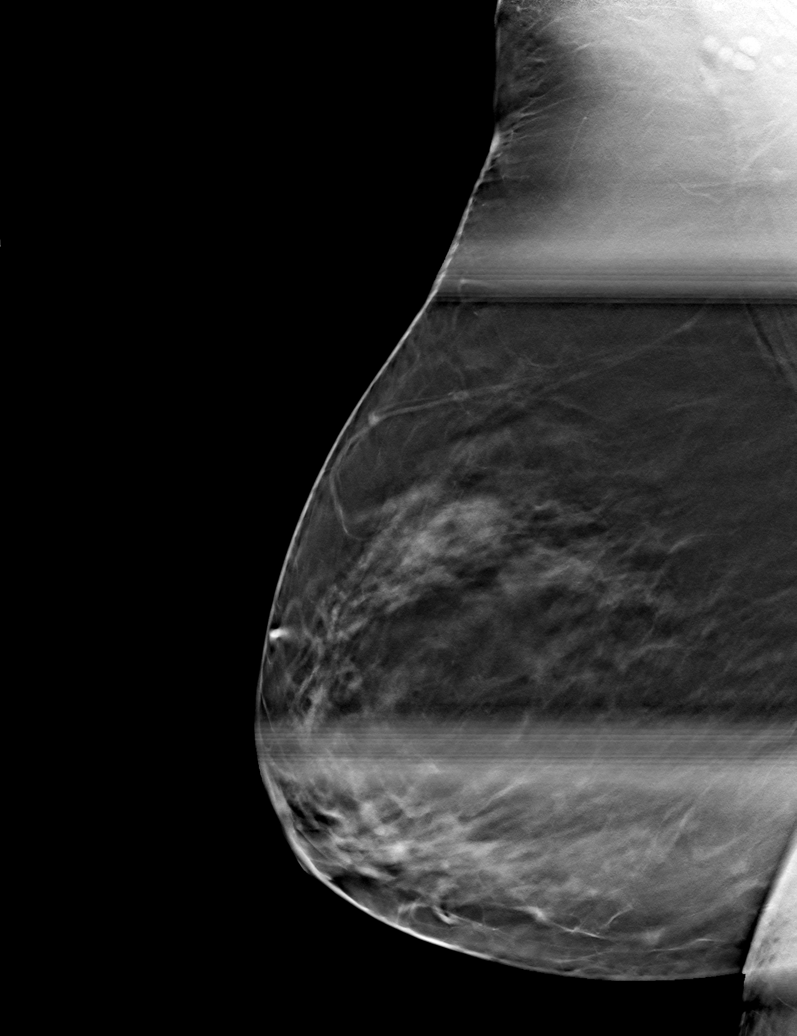

[R CC tomo (1 of 2) · tomo slice 25/48.0]
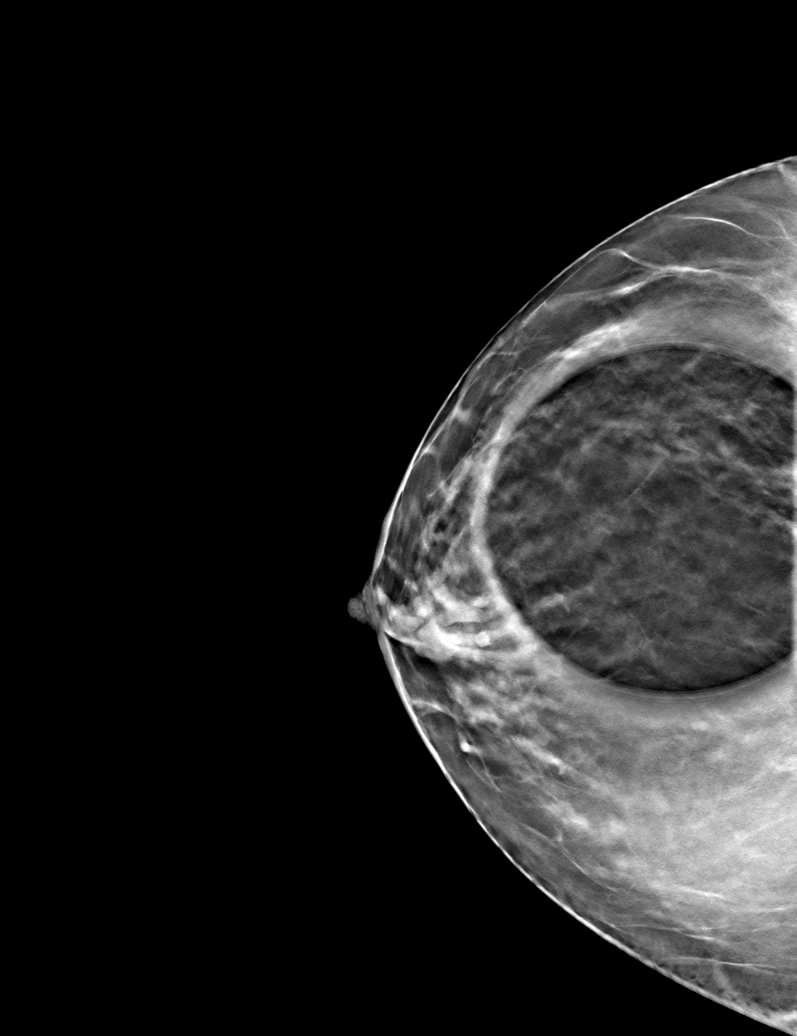

[R CC tomo (2 of 2) · tomo slice 31/60.0]
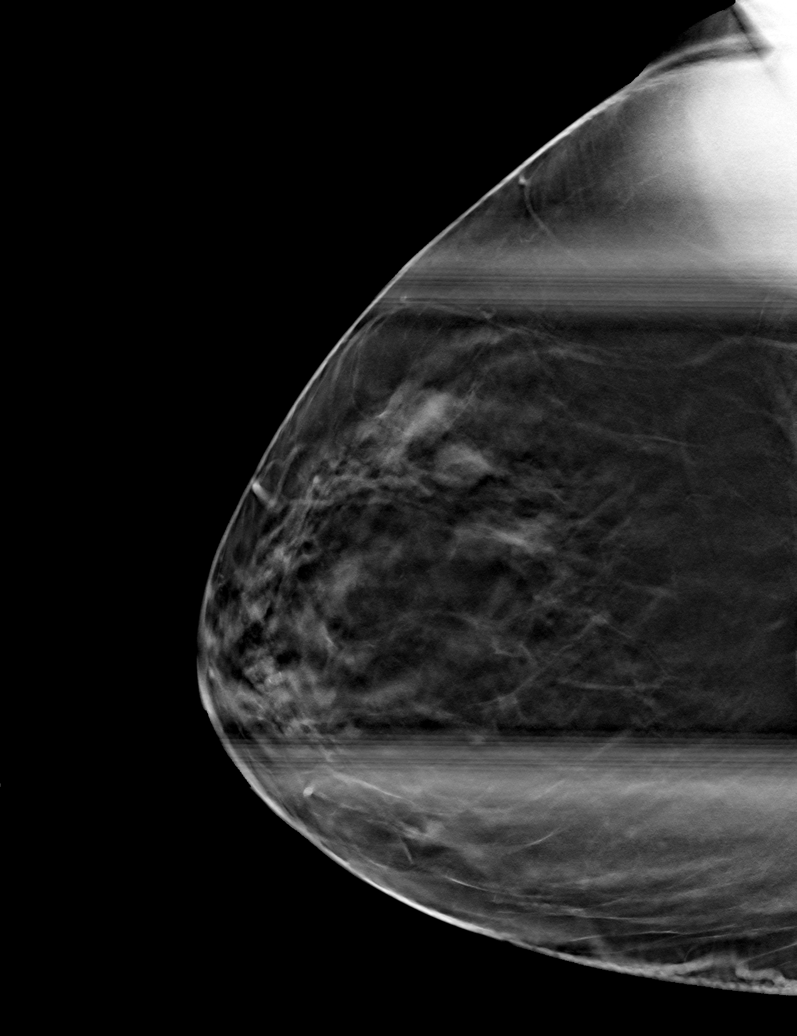

[6 of 18 positions shown; findings below may reference images not displayed]

ACR Breast Density Category b: There are scattered areas of
fibroglandular density.
FINDINGS: Spot compression CC and MLO tomograms were performed over the upper
slightly outer right breast demonstrating an oval mass with slightly
irregular margins measuring approximately 1.2 cm.

Mammographic images were processed with CAD.

Physical examination of the outer right breast does not reveal any
palpable masses.

Targeted ultrasound of the right breast was performed demonstrating
a mixed echogenicity predominantly hypoechoic mass with ill-defined
margins at 11 o'clock 4 cm from nipple measuring 1.1 x 0.8 x 0.7 cm.
This corresponds well with the mass seen at mammography. No
lymphadenopathy seen in the right axilla.
IMPRESSION: Suspicious right breast mass.

RECOMMENDATION:
Ultrasound-guided biopsy of the mass in the right breast at the 11
o'clock position is recommended. This is being scheduled for the
patient.

I have discussed the findings and recommendations with the patient.
Results were also provided in writing at the conclusion of the
visit. If applicable, a reminder letter will be sent to the patient
regarding the next appointment.

BI-RADS CATEGORY  4: Suspicious.

## 2018-02-23 DIAGNOSIS — Z6839 Body mass index (BMI) 39.0-39.9, adult: Secondary | ICD-10-CM | POA: Diagnosis not present

## 2018-02-23 DIAGNOSIS — I10 Essential (primary) hypertension: Secondary | ICD-10-CM | POA: Diagnosis not present

## 2018-02-23 DIAGNOSIS — F419 Anxiety disorder, unspecified: Secondary | ICD-10-CM | POA: Diagnosis not present

## 2018-06-08 ENCOUNTER — Other Ambulatory Visit (HOSPITAL_COMMUNITY): Payer: Self-pay | Admitting: Internal Medicine

## 2018-06-08 DIAGNOSIS — Z1231 Encounter for screening mammogram for malignant neoplasm of breast: Secondary | ICD-10-CM

## 2018-08-17 DIAGNOSIS — F419 Anxiety disorder, unspecified: Secondary | ICD-10-CM | POA: Diagnosis not present

## 2018-08-17 DIAGNOSIS — I10 Essential (primary) hypertension: Secondary | ICD-10-CM | POA: Diagnosis not present

## 2018-08-17 DIAGNOSIS — E6609 Other obesity due to excess calories: Secondary | ICD-10-CM | POA: Diagnosis not present

## 2018-08-24 ENCOUNTER — Ambulatory Visit (HOSPITAL_COMMUNITY): Payer: Medicare Other

## 2018-08-24 DIAGNOSIS — E1169 Type 2 diabetes mellitus with other specified complication: Secondary | ICD-10-CM | POA: Diagnosis not present

## 2018-08-24 DIAGNOSIS — I10 Essential (primary) hypertension: Secondary | ICD-10-CM | POA: Diagnosis not present

## 2018-08-24 DIAGNOSIS — F419 Anxiety disorder, unspecified: Secondary | ICD-10-CM | POA: Diagnosis not present

## 2018-08-24 DIAGNOSIS — E785 Hyperlipidemia, unspecified: Secondary | ICD-10-CM | POA: Diagnosis not present

## 2018-09-21 ENCOUNTER — Ambulatory Visit (HOSPITAL_COMMUNITY): Payer: Medicare Other

## 2018-10-15 ENCOUNTER — Ambulatory Visit (HOSPITAL_COMMUNITY)
Admission: RE | Admit: 2018-10-15 | Discharge: 2018-10-15 | Disposition: A | Discharge status: Home / Self Care | Payer: Medicare Other | Source: Ambulatory Visit | Attending: Internal Medicine | Admitting: Internal Medicine

## 2018-10-15 ENCOUNTER — Other Ambulatory Visit: Payer: Self-pay

## 2018-10-15 DIAGNOSIS — Z1231 Encounter for screening mammogram for malignant neoplasm of breast: Secondary | ICD-10-CM | POA: Diagnosis not present

## 2018-11-03 DIAGNOSIS — E119 Type 2 diabetes mellitus without complications: Secondary | ICD-10-CM | POA: Diagnosis not present

## 2018-11-03 DIAGNOSIS — H25811 Combined forms of age-related cataract, right eye: Secondary | ICD-10-CM | POA: Diagnosis not present

## 2018-11-03 DIAGNOSIS — H524 Presbyopia: Secondary | ICD-10-CM | POA: Diagnosis not present

## 2018-11-17 DIAGNOSIS — E119 Type 2 diabetes mellitus without complications: Secondary | ICD-10-CM | POA: Diagnosis not present

## 2018-12-01 DIAGNOSIS — E1129 Type 2 diabetes mellitus with other diabetic kidney complication: Secondary | ICD-10-CM | POA: Diagnosis not present

## 2018-12-01 DIAGNOSIS — I10 Essential (primary) hypertension: Secondary | ICD-10-CM | POA: Diagnosis not present

## 2018-12-10 IMAGING — US ULTRASOUND RIGHT BREAST LIMITED
1 series · 8 of 8 positions shown · non-contrast
Comparison: Screening mammogram dated 06/24/2016.

CLINICAL DATA: Screening recall for possible right breast mass.

EXAM:
2D DIGITAL DIAGNOSTIC UNILATERAL RIGHT MAMMOGRAM WITH CAD AND
ADJUNCT TOMO
RIGHT BREAST ULTRASOUND

[Series 1: ultrasound right breast limited · 0.07mm/px · 8 of 8 slices shown]
[im 1/8]
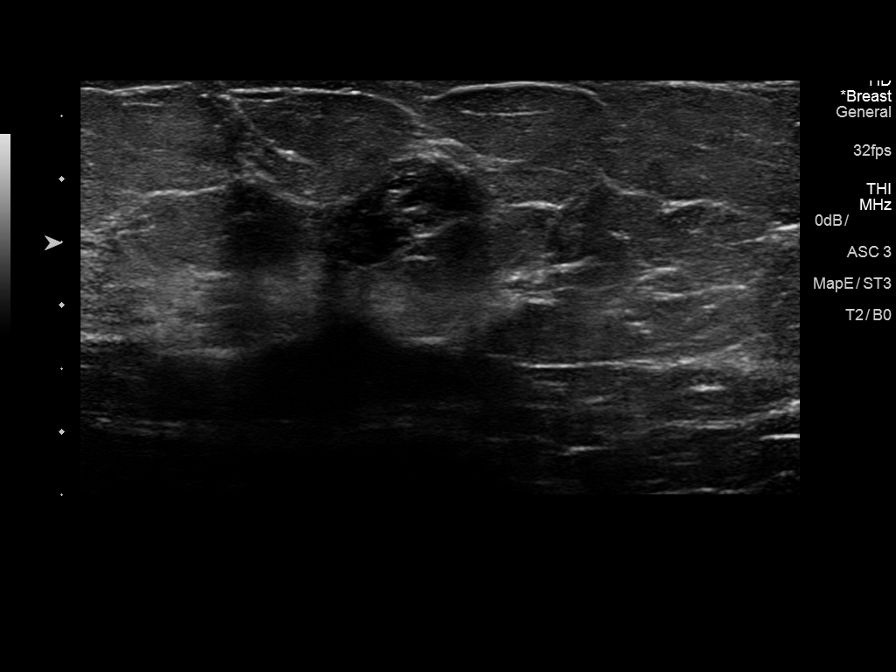
[im 2/8]
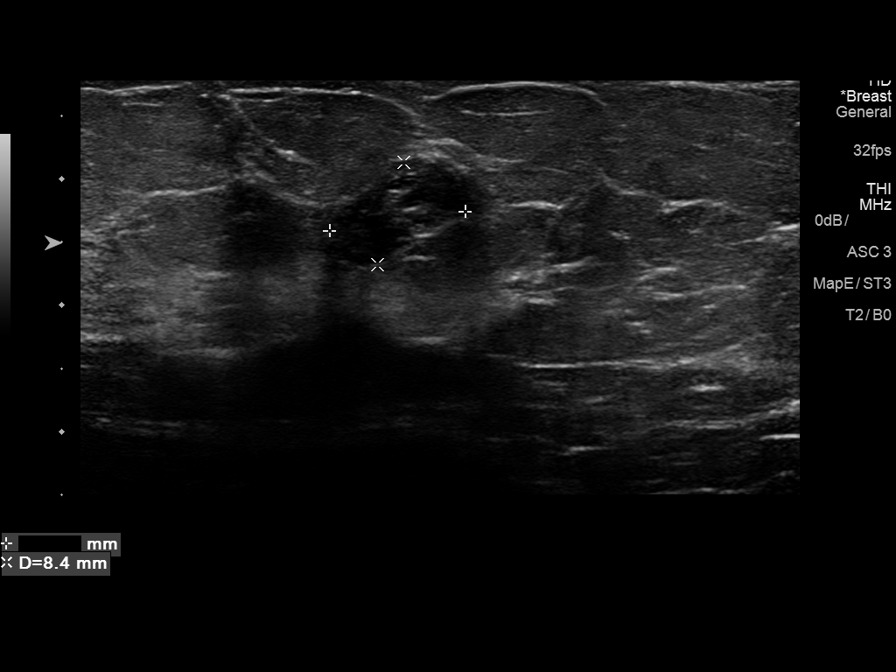
[im 3/8]
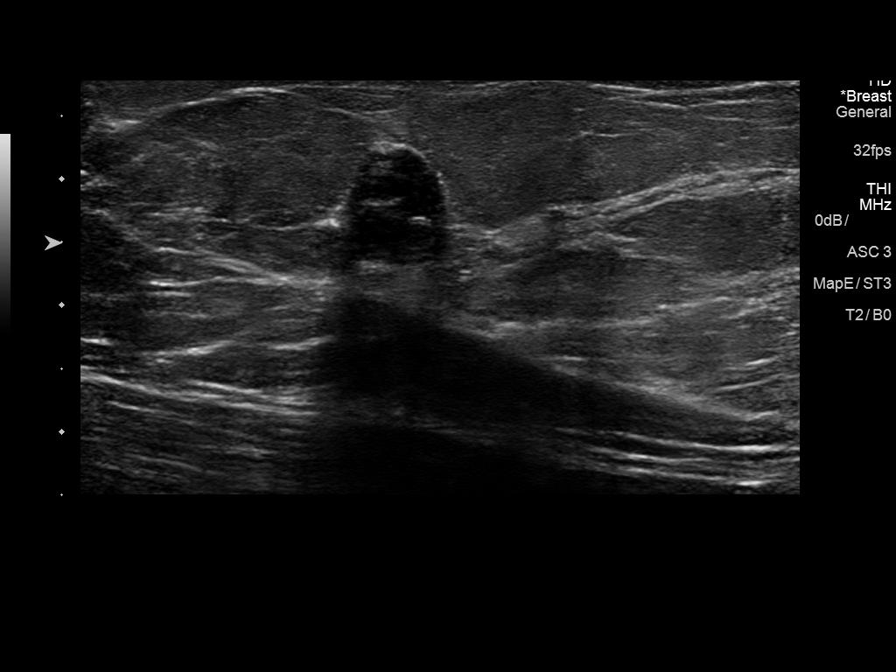
[im 4/8]
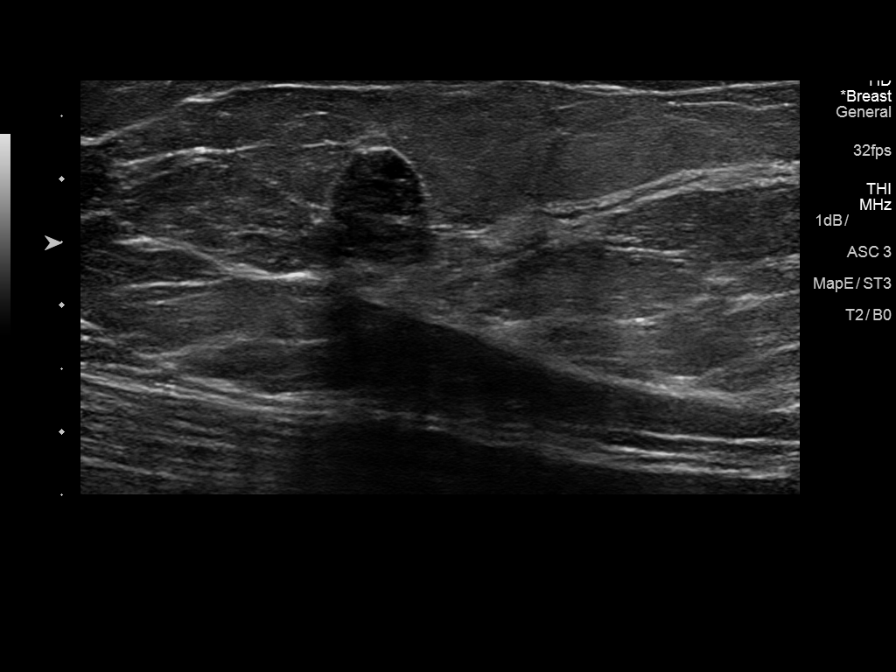
[im 5/8]
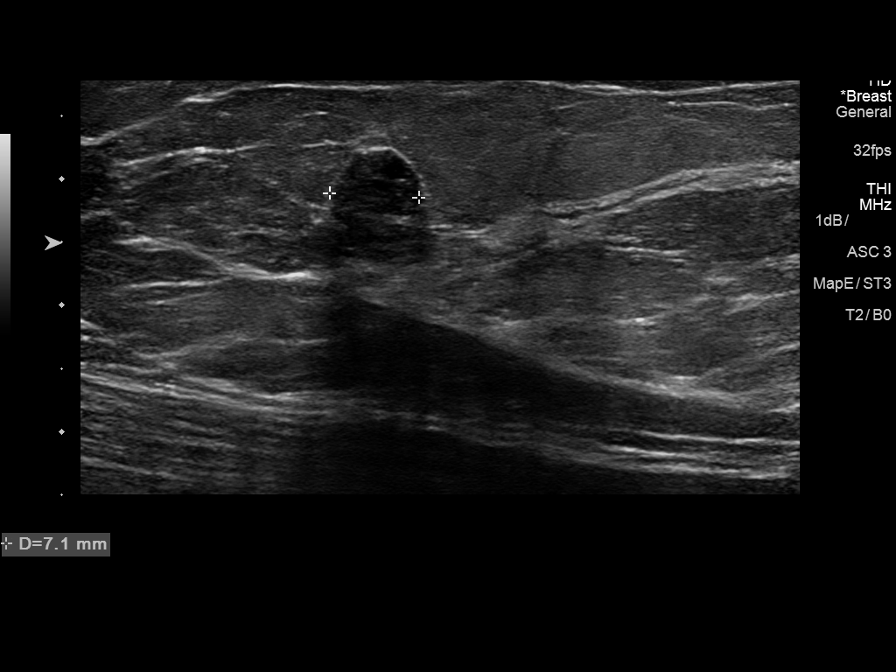
[im 6/8]
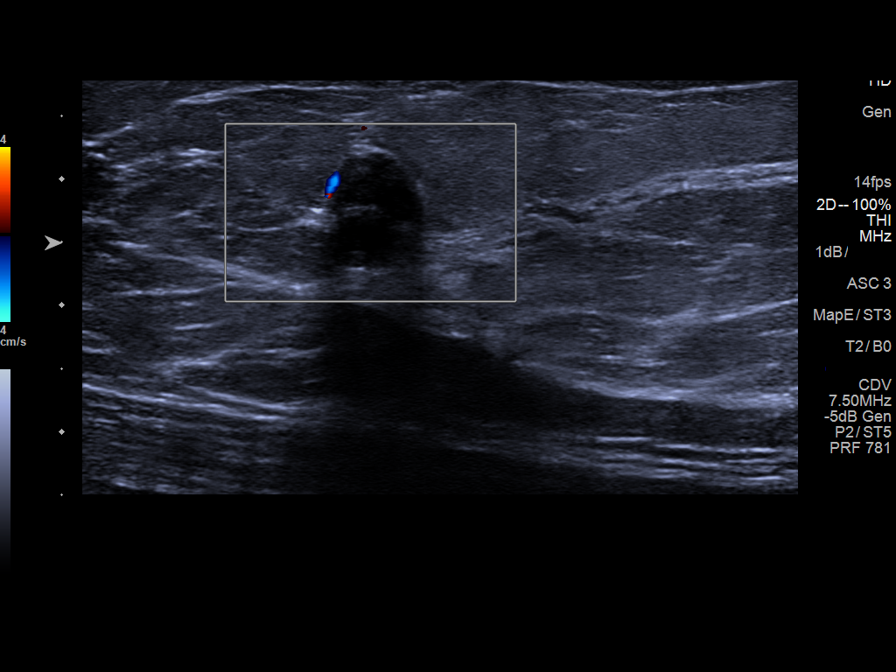
[im 7/8]
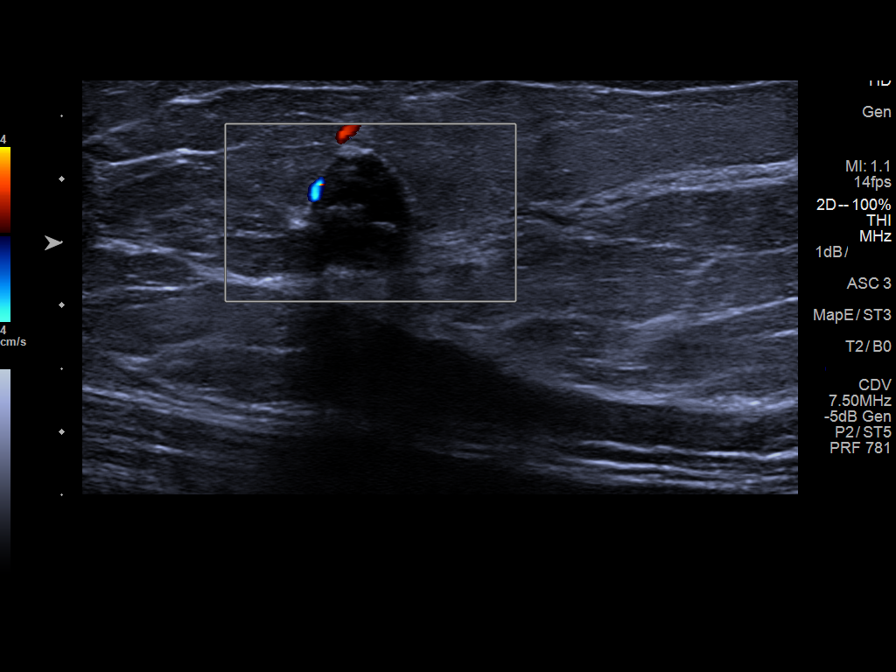
[im 8/8]
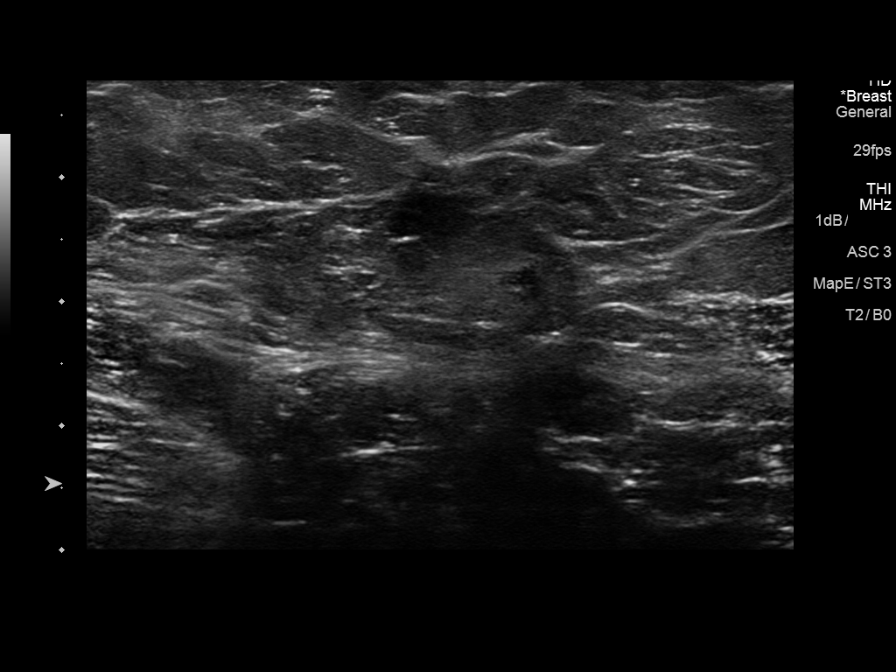

[8 of 8 positions shown; findings below may reference images not displayed]

ACR Breast Density Category b: There are scattered areas of
fibroglandular density.
FINDINGS: Spot compression CC and MLO tomograms were performed over the upper
slightly outer right breast demonstrating an oval mass with slightly
irregular margins measuring approximately 1.2 cm.

Mammographic images were processed with CAD.

Physical examination of the outer right breast does not reveal any
palpable masses.

Targeted ultrasound of the right breast was performed demonstrating
a mixed echogenicity predominantly hypoechoic mass with ill-defined
margins at 11 o'clock 4 cm from nipple measuring 1.1 x 0.8 x 0.7 cm.
This corresponds well with the mass seen at mammography. No
lymphadenopathy seen in the right axilla.
IMPRESSION: Suspicious right breast mass.

RECOMMENDATION:
Ultrasound-guided biopsy of the mass in the right breast at the 11
o'clock position is recommended. This is being scheduled for the
patient.

I have discussed the findings and recommendations with the patient.
Results were also provided in writing at the conclusion of the
visit. If applicable, a reminder letter will be sent to the patient
regarding the next appointment.

BI-RADS CATEGORY  4: Suspicious.

## 2019-04-20 DIAGNOSIS — E1129 Type 2 diabetes mellitus with other diabetic kidney complication: Secondary | ICD-10-CM | POA: Diagnosis not present

## 2019-04-27 DIAGNOSIS — I10 Essential (primary) hypertension: Secondary | ICD-10-CM | POA: Diagnosis not present

## 2019-04-27 DIAGNOSIS — E1129 Type 2 diabetes mellitus with other diabetic kidney complication: Secondary | ICD-10-CM | POA: Diagnosis not present

## 2019-08-18 DIAGNOSIS — Z79899 Other long term (current) drug therapy: Secondary | ICD-10-CM | POA: Diagnosis not present

## 2019-08-18 DIAGNOSIS — I10 Essential (primary) hypertension: Secondary | ICD-10-CM | POA: Diagnosis not present

## 2019-08-18 DIAGNOSIS — F419 Anxiety disorder, unspecified: Secondary | ICD-10-CM | POA: Diagnosis not present

## 2019-08-18 DIAGNOSIS — E785 Hyperlipidemia, unspecified: Secondary | ICD-10-CM | POA: Diagnosis not present

## 2019-08-25 DIAGNOSIS — E1122 Type 2 diabetes mellitus with diabetic chronic kidney disease: Secondary | ICD-10-CM | POA: Diagnosis not present

## 2019-08-25 DIAGNOSIS — E785 Hyperlipidemia, unspecified: Secondary | ICD-10-CM | POA: Diagnosis not present

## 2019-08-25 DIAGNOSIS — N1831 Chronic kidney disease, stage 3a: Secondary | ICD-10-CM | POA: Diagnosis not present

## 2019-08-25 DIAGNOSIS — I10 Essential (primary) hypertension: Secondary | ICD-10-CM | POA: Diagnosis not present

## 2019-10-15 ENCOUNTER — Other Ambulatory Visit (HOSPITAL_COMMUNITY): Payer: Self-pay | Admitting: Internal Medicine

## 2019-10-15 DIAGNOSIS — Z1231 Encounter for screening mammogram for malignant neoplasm of breast: Secondary | ICD-10-CM

## 2019-11-17 ENCOUNTER — Other Ambulatory Visit: Payer: Self-pay

## 2019-11-17 ENCOUNTER — Ambulatory Visit (HOSPITAL_COMMUNITY)
Admission: RE | Admit: 2019-11-17 | Discharge: 2019-11-17 | Disposition: A | Discharge status: Home / Self Care | Payer: Medicare Other | Source: Ambulatory Visit | Attending: Internal Medicine | Admitting: Internal Medicine

## 2019-11-17 DIAGNOSIS — Z1231 Encounter for screening mammogram for malignant neoplasm of breast: Secondary | ICD-10-CM | POA: Diagnosis not present

## 2019-11-22 ENCOUNTER — Other Ambulatory Visit (HOSPITAL_COMMUNITY): Payer: Self-pay | Admitting: Internal Medicine

## 2019-11-22 DIAGNOSIS — R921 Mammographic calcification found on diagnostic imaging of breast: Secondary | ICD-10-CM

## 2019-12-14 ENCOUNTER — Other Ambulatory Visit: Payer: Self-pay

## 2019-12-14 ENCOUNTER — Ambulatory Visit (HOSPITAL_COMMUNITY)
Admission: RE | Admit: 2019-12-14 | Discharge: 2019-12-14 | Disposition: A | Discharge status: Home / Self Care | Payer: Medicare Other | Source: Ambulatory Visit | Attending: Internal Medicine | Admitting: Internal Medicine

## 2019-12-14 DIAGNOSIS — R921 Mammographic calcification found on diagnostic imaging of breast: Secondary | ICD-10-CM | POA: Diagnosis not present

## 2019-12-16 ENCOUNTER — Other Ambulatory Visit: Payer: Self-pay | Admitting: Internal Medicine

## 2019-12-16 DIAGNOSIS — R921 Mammographic calcification found on diagnostic imaging of breast: Secondary | ICD-10-CM

## 2019-12-21 ENCOUNTER — Ambulatory Visit
Admission: RE | Admit: 2019-12-21 | Discharge: 2019-12-21 | Disposition: A | Discharge status: Home / Self Care | Payer: Medicare Other | Source: Ambulatory Visit | Attending: Internal Medicine | Admitting: Internal Medicine

## 2019-12-21 ENCOUNTER — Other Ambulatory Visit: Payer: Self-pay

## 2019-12-21 DIAGNOSIS — R921 Mammographic calcification found on diagnostic imaging of breast: Secondary | ICD-10-CM

## 2019-12-21 DIAGNOSIS — D242 Benign neoplasm of left breast: Secondary | ICD-10-CM | POA: Diagnosis not present

## 2019-12-21 HISTORY — PX: BREAST BIOPSY: SHX20

## 2020-01-04 DIAGNOSIS — E1129 Type 2 diabetes mellitus with other diabetic kidney complication: Secondary | ICD-10-CM | POA: Diagnosis not present

## 2020-01-04 DIAGNOSIS — Z0001 Encounter for general adult medical examination with abnormal findings: Secondary | ICD-10-CM | POA: Diagnosis not present

## 2020-01-04 DIAGNOSIS — I10 Essential (primary) hypertension: Secondary | ICD-10-CM | POA: Diagnosis not present

## 2020-01-13 DIAGNOSIS — H25813 Combined forms of age-related cataract, bilateral: Secondary | ICD-10-CM | POA: Diagnosis not present

## 2020-01-13 DIAGNOSIS — H524 Presbyopia: Secondary | ICD-10-CM | POA: Diagnosis not present

## 2020-01-13 DIAGNOSIS — E119 Type 2 diabetes mellitus without complications: Secondary | ICD-10-CM | POA: Diagnosis not present

## 2020-04-06 DIAGNOSIS — I10 Essential (primary) hypertension: Secondary | ICD-10-CM | POA: Diagnosis not present

## 2020-04-06 DIAGNOSIS — E1122 Type 2 diabetes mellitus with diabetic chronic kidney disease: Secondary | ICD-10-CM | POA: Diagnosis not present

## 2020-04-06 DIAGNOSIS — Z79899 Other long term (current) drug therapy: Secondary | ICD-10-CM | POA: Diagnosis not present

## 2020-04-06 DIAGNOSIS — F419 Anxiety disorder, unspecified: Secondary | ICD-10-CM | POA: Diagnosis not present

## 2020-04-06 DIAGNOSIS — E1129 Type 2 diabetes mellitus with other diabetic kidney complication: Secondary | ICD-10-CM | POA: Diagnosis not present

## 2020-04-06 DIAGNOSIS — Z0000 Encounter for general adult medical examination without abnormal findings: Secondary | ICD-10-CM | POA: Diagnosis not present

## 2020-07-28 DIAGNOSIS — I10 Essential (primary) hypertension: Secondary | ICD-10-CM | POA: Diagnosis not present

## 2020-07-28 DIAGNOSIS — F419 Anxiety disorder, unspecified: Secondary | ICD-10-CM | POA: Diagnosis not present

## 2020-07-28 DIAGNOSIS — Z79899 Other long term (current) drug therapy: Secondary | ICD-10-CM | POA: Diagnosis not present

## 2020-07-28 DIAGNOSIS — E1129 Type 2 diabetes mellitus with other diabetic kidney complication: Secondary | ICD-10-CM | POA: Diagnosis not present

## 2020-08-11 DIAGNOSIS — Z0001 Encounter for general adult medical examination with abnormal findings: Secondary | ICD-10-CM | POA: Diagnosis not present

## 2020-08-11 DIAGNOSIS — E785 Hyperlipidemia, unspecified: Secondary | ICD-10-CM | POA: Diagnosis not present

## 2020-08-11 DIAGNOSIS — N1832 Chronic kidney disease, stage 3b: Secondary | ICD-10-CM | POA: Diagnosis not present

## 2020-08-11 DIAGNOSIS — E1122 Type 2 diabetes mellitus with diabetic chronic kidney disease: Secondary | ICD-10-CM | POA: Diagnosis not present

## 2020-11-17 ENCOUNTER — Other Ambulatory Visit: Payer: Self-pay | Admitting: Internal Medicine

## 2020-11-17 DIAGNOSIS — Z1231 Encounter for screening mammogram for malignant neoplasm of breast: Secondary | ICD-10-CM

## 2020-12-12 DIAGNOSIS — E785 Hyperlipidemia, unspecified: Secondary | ICD-10-CM | POA: Diagnosis not present

## 2020-12-12 DIAGNOSIS — Z1159 Encounter for screening for other viral diseases: Secondary | ICD-10-CM | POA: Diagnosis not present

## 2020-12-12 DIAGNOSIS — E1129 Type 2 diabetes mellitus with other diabetic kidney complication: Secondary | ICD-10-CM | POA: Diagnosis not present

## 2020-12-12 DIAGNOSIS — Z79899 Other long term (current) drug therapy: Secondary | ICD-10-CM | POA: Diagnosis not present

## 2020-12-19 DIAGNOSIS — E1122 Type 2 diabetes mellitus with diabetic chronic kidney disease: Secondary | ICD-10-CM | POA: Diagnosis not present

## 2020-12-19 DIAGNOSIS — I10 Essential (primary) hypertension: Secondary | ICD-10-CM | POA: Diagnosis not present

## 2020-12-19 DIAGNOSIS — N1831 Chronic kidney disease, stage 3a: Secondary | ICD-10-CM | POA: Diagnosis not present

## 2020-12-19 DIAGNOSIS — R7309 Other abnormal glucose: Secondary | ICD-10-CM | POA: Diagnosis not present

## 2021-01-23 ENCOUNTER — Other Ambulatory Visit: Payer: Self-pay

## 2021-01-23 ENCOUNTER — Ambulatory Visit
Admission: RE | Admit: 2021-01-23 | Discharge: 2021-01-23 | Disposition: A | Discharge status: Home / Self Care | Payer: Medicare Other | Source: Ambulatory Visit | Attending: Internal Medicine | Admitting: Internal Medicine

## 2021-01-23 DIAGNOSIS — Z1231 Encounter for screening mammogram for malignant neoplasm of breast: Secondary | ICD-10-CM

## 2021-03-15 DIAGNOSIS — E1129 Type 2 diabetes mellitus with other diabetic kidney complication: Secondary | ICD-10-CM | POA: Diagnosis not present

## 2021-03-15 DIAGNOSIS — Z79899 Other long term (current) drug therapy: Secondary | ICD-10-CM | POA: Diagnosis not present

## 2021-03-15 DIAGNOSIS — I10 Essential (primary) hypertension: Secondary | ICD-10-CM | POA: Diagnosis not present

## 2021-03-15 DIAGNOSIS — N1831 Chronic kidney disease, stage 3a: Secondary | ICD-10-CM | POA: Diagnosis not present

## 2021-03-28 DIAGNOSIS — I10 Essential (primary) hypertension: Secondary | ICD-10-CM | POA: Diagnosis not present

## 2021-03-28 DIAGNOSIS — N1831 Chronic kidney disease, stage 3a: Secondary | ICD-10-CM | POA: Diagnosis not present

## 2021-03-28 DIAGNOSIS — R7309 Other abnormal glucose: Secondary | ICD-10-CM | POA: Diagnosis not present

## 2021-03-28 DIAGNOSIS — E1122 Type 2 diabetes mellitus with diabetic chronic kidney disease: Secondary | ICD-10-CM | POA: Diagnosis not present

## 2021-06-22 DIAGNOSIS — H524 Presbyopia: Secondary | ICD-10-CM | POA: Diagnosis not present

## 2021-07-23 DIAGNOSIS — N183 Chronic kidney disease, stage 3 unspecified: Secondary | ICD-10-CM | POA: Diagnosis not present

## 2021-07-23 DIAGNOSIS — E785 Hyperlipidemia, unspecified: Secondary | ICD-10-CM | POA: Diagnosis not present

## 2021-07-23 DIAGNOSIS — E1129 Type 2 diabetes mellitus with other diabetic kidney complication: Secondary | ICD-10-CM | POA: Diagnosis not present

## 2021-07-23 DIAGNOSIS — I10 Essential (primary) hypertension: Secondary | ICD-10-CM | POA: Diagnosis not present

## 2021-07-30 DIAGNOSIS — R7309 Other abnormal glucose: Secondary | ICD-10-CM | POA: Diagnosis not present

## 2021-07-30 DIAGNOSIS — E1122 Type 2 diabetes mellitus with diabetic chronic kidney disease: Secondary | ICD-10-CM | POA: Diagnosis not present

## 2021-07-30 DIAGNOSIS — E785 Hyperlipidemia, unspecified: Secondary | ICD-10-CM | POA: Diagnosis not present

## 2021-07-30 DIAGNOSIS — N1831 Chronic kidney disease, stage 3a: Secondary | ICD-10-CM | POA: Diagnosis not present

## 2021-08-02 ENCOUNTER — Other Ambulatory Visit: Payer: Self-pay | Admitting: Internal Medicine

## 2021-08-02 ENCOUNTER — Other Ambulatory Visit (HOSPITAL_COMMUNITY): Payer: Self-pay | Admitting: Internal Medicine

## 2021-08-02 DIAGNOSIS — N1832 Chronic kidney disease, stage 3b: Secondary | ICD-10-CM

## 2021-08-09 ENCOUNTER — Other Ambulatory Visit: Payer: Self-pay

## 2021-08-09 ENCOUNTER — Ambulatory Visit (HOSPITAL_COMMUNITY): Admission: RE | Admit: 2021-08-09 | Payer: Medicare Other | Source: Ambulatory Visit

## 2021-08-09 ENCOUNTER — Ambulatory Visit (HOSPITAL_COMMUNITY)
Admission: RE | Admit: 2021-08-09 | Discharge: 2021-08-09 | Disposition: A | Discharge status: Home / Self Care | Payer: Medicare Other | Source: Ambulatory Visit | Attending: Internal Medicine | Admitting: Internal Medicine

## 2021-08-09 DIAGNOSIS — N1832 Chronic kidney disease, stage 3b: Secondary | ICD-10-CM | POA: Insufficient documentation

## 2021-08-09 DIAGNOSIS — N189 Chronic kidney disease, unspecified: Secondary | ICD-10-CM | POA: Diagnosis not present

## 2021-08-10 ENCOUNTER — Ambulatory Visit (HOSPITAL_COMMUNITY): Admission: RE | Admit: 2021-08-10 | Payer: Medicare Other | Source: Ambulatory Visit

## 2021-08-10 ENCOUNTER — Ambulatory Visit (HOSPITAL_COMMUNITY): Payer: Medicare Other

## 2021-09-26 DIAGNOSIS — N816 Rectocele: Secondary | ICD-10-CM | POA: Diagnosis not present

## 2021-09-26 DIAGNOSIS — Z7689 Persons encountering health services in other specified circumstances: Secondary | ICD-10-CM | POA: Diagnosis not present

## 2021-09-26 DIAGNOSIS — N8110 Cystocele, unspecified: Secondary | ICD-10-CM | POA: Diagnosis not present

## 2021-09-26 DIAGNOSIS — R351 Nocturia: Secondary | ICD-10-CM | POA: Diagnosis not present

## 2021-11-14 DIAGNOSIS — E785 Hyperlipidemia, unspecified: Secondary | ICD-10-CM | POA: Diagnosis not present

## 2021-11-14 DIAGNOSIS — E1129 Type 2 diabetes mellitus with other diabetic kidney complication: Secondary | ICD-10-CM | POA: Diagnosis not present

## 2021-11-14 DIAGNOSIS — N1832 Chronic kidney disease, stage 3b: Secondary | ICD-10-CM | POA: Diagnosis not present

## 2021-11-14 DIAGNOSIS — Z79899 Other long term (current) drug therapy: Secondary | ICD-10-CM | POA: Diagnosis not present

## 2021-11-29 DIAGNOSIS — N1831 Chronic kidney disease, stage 3a: Secondary | ICD-10-CM | POA: Diagnosis not present

## 2021-11-29 DIAGNOSIS — E1122 Type 2 diabetes mellitus with diabetic chronic kidney disease: Secondary | ICD-10-CM | POA: Diagnosis not present

## 2021-11-29 DIAGNOSIS — I10 Essential (primary) hypertension: Secondary | ICD-10-CM | POA: Diagnosis not present

## 2021-11-29 DIAGNOSIS — R7309 Other abnormal glucose: Secondary | ICD-10-CM | POA: Diagnosis not present

## 2021-12-07 DIAGNOSIS — N952 Postmenopausal atrophic vaginitis: Secondary | ICD-10-CM | POA: Diagnosis not present

## 2021-12-07 DIAGNOSIS — N993 Prolapse of vaginal vault after hysterectomy: Secondary | ICD-10-CM | POA: Diagnosis not present

## 2021-12-07 DIAGNOSIS — Z79899 Other long term (current) drug therapy: Secondary | ICD-10-CM | POA: Diagnosis not present

## 2021-12-17 DIAGNOSIS — N993 Prolapse of vaginal vault after hysterectomy: Secondary | ICD-10-CM | POA: Diagnosis not present

## 2021-12-17 DIAGNOSIS — Z79899 Other long term (current) drug therapy: Secondary | ICD-10-CM | POA: Diagnosis not present

## 2021-12-20 DIAGNOSIS — R351 Nocturia: Secondary | ICD-10-CM | POA: Diagnosis not present

## 2021-12-20 DIAGNOSIS — N8110 Cystocele, unspecified: Secondary | ICD-10-CM | POA: Diagnosis not present

## 2021-12-20 DIAGNOSIS — N816 Rectocele: Secondary | ICD-10-CM | POA: Diagnosis not present

## 2021-12-21 ENCOUNTER — Other Ambulatory Visit: Payer: Self-pay | Admitting: Internal Medicine

## 2021-12-21 DIAGNOSIS — Z1231 Encounter for screening mammogram for malignant neoplasm of breast: Secondary | ICD-10-CM

## 2022-01-01 DIAGNOSIS — N189 Chronic kidney disease, unspecified: Secondary | ICD-10-CM | POA: Diagnosis not present

## 2022-01-01 DIAGNOSIS — F32A Depression, unspecified: Secondary | ICD-10-CM | POA: Diagnosis not present

## 2022-01-01 DIAGNOSIS — Z7984 Long term (current) use of oral hypoglycemic drugs: Secondary | ICD-10-CM | POA: Diagnosis not present

## 2022-01-01 DIAGNOSIS — N736 Female pelvic peritoneal adhesions (postinfective): Secondary | ICD-10-CM | POA: Diagnosis not present

## 2022-01-01 DIAGNOSIS — F419 Anxiety disorder, unspecified: Secondary | ICD-10-CM | POA: Diagnosis not present

## 2022-01-01 DIAGNOSIS — K660 Peritoneal adhesions (postprocedural) (postinfection): Secondary | ICD-10-CM | POA: Diagnosis not present

## 2022-01-01 DIAGNOSIS — I129 Hypertensive chronic kidney disease with stage 1 through stage 4 chronic kidney disease, or unspecified chronic kidney disease: Secondary | ICD-10-CM | POA: Diagnosis not present

## 2022-01-01 DIAGNOSIS — E7800 Pure hypercholesterolemia, unspecified: Secondary | ICD-10-CM | POA: Diagnosis not present

## 2022-01-01 DIAGNOSIS — E1122 Type 2 diabetes mellitus with diabetic chronic kidney disease: Secondary | ICD-10-CM | POA: Diagnosis not present

## 2022-01-01 DIAGNOSIS — Z87891 Personal history of nicotine dependence: Secondary | ICD-10-CM | POA: Diagnosis not present

## 2022-01-01 DIAGNOSIS — N993 Prolapse of vaginal vault after hysterectomy: Secondary | ICD-10-CM | POA: Diagnosis not present

## 2022-01-01 DIAGNOSIS — N952 Postmenopausal atrophic vaginitis: Secondary | ICD-10-CM | POA: Diagnosis not present

## 2022-01-01 DIAGNOSIS — Z79899 Other long term (current) drug therapy: Secondary | ICD-10-CM | POA: Diagnosis not present

## 2022-01-02 DIAGNOSIS — I129 Hypertensive chronic kidney disease with stage 1 through stage 4 chronic kidney disease, or unspecified chronic kidney disease: Secondary | ICD-10-CM | POA: Diagnosis not present

## 2022-01-02 DIAGNOSIS — F419 Anxiety disorder, unspecified: Secondary | ICD-10-CM | POA: Diagnosis not present

## 2022-01-02 DIAGNOSIS — E7800 Pure hypercholesterolemia, unspecified: Secondary | ICD-10-CM | POA: Diagnosis not present

## 2022-01-02 DIAGNOSIS — F32A Depression, unspecified: Secondary | ICD-10-CM | POA: Diagnosis not present

## 2022-01-02 DIAGNOSIS — N993 Prolapse of vaginal vault after hysterectomy: Secondary | ICD-10-CM | POA: Diagnosis not present

## 2022-01-02 DIAGNOSIS — E1122 Type 2 diabetes mellitus with diabetic chronic kidney disease: Secondary | ICD-10-CM | POA: Diagnosis not present

## 2022-01-02 DIAGNOSIS — N189 Chronic kidney disease, unspecified: Secondary | ICD-10-CM | POA: Diagnosis not present

## 2022-01-02 DIAGNOSIS — Z79899 Other long term (current) drug therapy: Secondary | ICD-10-CM | POA: Diagnosis not present

## 2022-01-02 DIAGNOSIS — Z87891 Personal history of nicotine dependence: Secondary | ICD-10-CM | POA: Diagnosis not present

## 2022-01-02 DIAGNOSIS — N736 Female pelvic peritoneal adhesions (postinfective): Secondary | ICD-10-CM | POA: Diagnosis not present

## 2022-01-02 DIAGNOSIS — Z7984 Long term (current) use of oral hypoglycemic drugs: Secondary | ICD-10-CM | POA: Diagnosis not present

## 2022-01-02 DIAGNOSIS — N952 Postmenopausal atrophic vaginitis: Secondary | ICD-10-CM | POA: Diagnosis not present

## 2022-01-29 ENCOUNTER — Ambulatory Visit
Admission: RE | Admit: 2022-01-29 | Discharge: 2022-01-29 | Disposition: A | Discharge status: Home / Self Care | Payer: Medicare Other | Source: Ambulatory Visit | Attending: Internal Medicine | Admitting: Internal Medicine

## 2022-01-29 DIAGNOSIS — Z1231 Encounter for screening mammogram for malignant neoplasm of breast: Secondary | ICD-10-CM | POA: Diagnosis not present

## 2022-03-01 DIAGNOSIS — N993 Prolapse of vaginal vault after hysterectomy: Secondary | ICD-10-CM | POA: Diagnosis not present

## 2022-03-01 DIAGNOSIS — N952 Postmenopausal atrophic vaginitis: Secondary | ICD-10-CM | POA: Diagnosis not present

## 2022-03-21 DIAGNOSIS — E1129 Type 2 diabetes mellitus with other diabetic kidney complication: Secondary | ICD-10-CM | POA: Diagnosis not present

## 2022-03-21 DIAGNOSIS — Z79899 Other long term (current) drug therapy: Secondary | ICD-10-CM | POA: Diagnosis not present

## 2022-03-21 DIAGNOSIS — N182 Chronic kidney disease, stage 2 (mild): Secondary | ICD-10-CM | POA: Diagnosis not present

## 2022-03-21 DIAGNOSIS — I10 Essential (primary) hypertension: Secondary | ICD-10-CM | POA: Diagnosis not present

## 2022-03-26 ENCOUNTER — Encounter (INDEPENDENT_AMBULATORY_CARE_PROVIDER_SITE_OTHER): Payer: Self-pay | Admitting: *Deleted

## 2022-03-28 DIAGNOSIS — E1122 Type 2 diabetes mellitus with diabetic chronic kidney disease: Secondary | ICD-10-CM | POA: Diagnosis not present

## 2022-03-28 DIAGNOSIS — I10 Essential (primary) hypertension: Secondary | ICD-10-CM | POA: Diagnosis not present

## 2022-03-28 DIAGNOSIS — N1832 Chronic kidney disease, stage 3b: Secondary | ICD-10-CM | POA: Diagnosis not present

## 2022-03-28 DIAGNOSIS — R7309 Other abnormal glucose: Secondary | ICD-10-CM | POA: Diagnosis not present

## 2022-05-01 DIAGNOSIS — N952 Postmenopausal atrophic vaginitis: Secondary | ICD-10-CM | POA: Diagnosis not present

## 2022-05-01 DIAGNOSIS — R102 Pelvic and perineal pain: Secondary | ICD-10-CM | POA: Diagnosis not present

## 2022-05-01 DIAGNOSIS — N993 Prolapse of vaginal vault after hysterectomy: Secondary | ICD-10-CM | POA: Diagnosis not present

## 2022-07-01 DIAGNOSIS — R102 Pelvic and perineal pain: Secondary | ICD-10-CM | POA: Diagnosis not present

## 2022-07-01 DIAGNOSIS — N993 Prolapse of vaginal vault after hysterectomy: Secondary | ICD-10-CM | POA: Diagnosis not present

## 2022-07-01 DIAGNOSIS — N952 Postmenopausal atrophic vaginitis: Secondary | ICD-10-CM | POA: Diagnosis not present

## 2022-07-12 DIAGNOSIS — I10 Essential (primary) hypertension: Secondary | ICD-10-CM | POA: Diagnosis not present

## 2022-07-12 DIAGNOSIS — E119 Type 2 diabetes mellitus without complications: Secondary | ICD-10-CM | POA: Diagnosis not present

## 2022-07-12 DIAGNOSIS — Z87891 Personal history of nicotine dependence: Secondary | ICD-10-CM | POA: Diagnosis not present

## 2022-07-12 DIAGNOSIS — E669 Obesity, unspecified: Secondary | ICD-10-CM | POA: Diagnosis not present

## 2022-07-12 DIAGNOSIS — Z9889 Other specified postprocedural states: Secondary | ICD-10-CM | POA: Diagnosis not present

## 2022-07-12 DIAGNOSIS — E7800 Pure hypercholesterolemia, unspecified: Secondary | ICD-10-CM | POA: Diagnosis not present

## 2022-07-12 DIAGNOSIS — F32A Depression, unspecified: Secondary | ICD-10-CM | POA: Diagnosis not present

## 2022-07-12 DIAGNOSIS — Z7984 Long term (current) use of oral hypoglycemic drugs: Secondary | ICD-10-CM | POA: Diagnosis not present

## 2022-07-12 DIAGNOSIS — Z79899 Other long term (current) drug therapy: Secondary | ICD-10-CM | POA: Diagnosis not present

## 2022-07-12 DIAGNOSIS — F419 Anxiety disorder, unspecified: Secondary | ICD-10-CM | POA: Diagnosis not present

## 2022-07-12 DIAGNOSIS — Z90710 Acquired absence of both cervix and uterus: Secondary | ICD-10-CM | POA: Diagnosis not present

## 2022-07-12 DIAGNOSIS — N993 Prolapse of vaginal vault after hysterectomy: Secondary | ICD-10-CM | POA: Diagnosis not present

## 2022-07-12 DIAGNOSIS — N819 Female genital prolapse, unspecified: Secondary | ICD-10-CM | POA: Diagnosis not present

## 2022-07-22 DIAGNOSIS — I10 Essential (primary) hypertension: Secondary | ICD-10-CM | POA: Diagnosis not present

## 2022-07-22 DIAGNOSIS — E785 Hyperlipidemia, unspecified: Secondary | ICD-10-CM | POA: Diagnosis not present

## 2022-07-22 DIAGNOSIS — Z79899 Other long term (current) drug therapy: Secondary | ICD-10-CM | POA: Diagnosis not present

## 2022-07-22 DIAGNOSIS — E1129 Type 2 diabetes mellitus with other diabetic kidney complication: Secondary | ICD-10-CM | POA: Diagnosis not present

## 2022-07-22 DIAGNOSIS — N1832 Chronic kidney disease, stage 3b: Secondary | ICD-10-CM | POA: Diagnosis not present

## 2022-07-23 HISTORY — PX: BREAST BIOPSY: SHX20

## 2022-07-29 DIAGNOSIS — E1122 Type 2 diabetes mellitus with diabetic chronic kidney disease: Secondary | ICD-10-CM | POA: Diagnosis not present

## 2022-07-29 DIAGNOSIS — N1832 Chronic kidney disease, stage 3b: Secondary | ICD-10-CM | POA: Diagnosis not present

## 2022-07-29 DIAGNOSIS — E785 Hyperlipidemia, unspecified: Secondary | ICD-10-CM | POA: Diagnosis not present

## 2022-07-29 DIAGNOSIS — R7309 Other abnormal glucose: Secondary | ICD-10-CM | POA: Diagnosis not present

## 2022-07-29 DIAGNOSIS — I10 Essential (primary) hypertension: Secondary | ICD-10-CM | POA: Diagnosis not present

## 2022-08-21 DIAGNOSIS — N993 Prolapse of vaginal vault after hysterectomy: Secondary | ICD-10-CM | POA: Diagnosis not present

## 2022-09-18 DIAGNOSIS — K080 Exfoliation of teeth due to systemic causes: Secondary | ICD-10-CM | POA: Diagnosis not present

## 2022-11-05 DIAGNOSIS — N1832 Chronic kidney disease, stage 3b: Secondary | ICD-10-CM | POA: Diagnosis not present

## 2022-11-05 DIAGNOSIS — Z79899 Other long term (current) drug therapy: Secondary | ICD-10-CM | POA: Diagnosis not present

## 2022-11-05 DIAGNOSIS — E1129 Type 2 diabetes mellitus with other diabetic kidney complication: Secondary | ICD-10-CM | POA: Diagnosis not present

## 2022-11-05 DIAGNOSIS — I10 Essential (primary) hypertension: Secondary | ICD-10-CM | POA: Diagnosis not present

## 2022-11-19 DIAGNOSIS — E785 Hyperlipidemia, unspecified: Secondary | ICD-10-CM | POA: Diagnosis not present

## 2022-11-19 DIAGNOSIS — R7309 Other abnormal glucose: Secondary | ICD-10-CM | POA: Diagnosis not present

## 2022-11-19 DIAGNOSIS — N1831 Chronic kidney disease, stage 3a: Secondary | ICD-10-CM | POA: Diagnosis not present

## 2022-11-19 DIAGNOSIS — E1122 Type 2 diabetes mellitus with diabetic chronic kidney disease: Secondary | ICD-10-CM | POA: Diagnosis not present

## 2022-11-19 DIAGNOSIS — I10 Essential (primary) hypertension: Secondary | ICD-10-CM | POA: Diagnosis not present

## 2022-12-11 DIAGNOSIS — K080 Exfoliation of teeth due to systemic causes: Secondary | ICD-10-CM | POA: Diagnosis not present

## 2023-02-10 DIAGNOSIS — K080 Exfoliation of teeth due to systemic causes: Secondary | ICD-10-CM | POA: Diagnosis not present

## 2023-02-28 DIAGNOSIS — Z8742 Personal history of other diseases of the female genital tract: Secondary | ICD-10-CM | POA: Diagnosis not present

## 2023-02-28 DIAGNOSIS — N993 Prolapse of vaginal vault after hysterectomy: Secondary | ICD-10-CM | POA: Diagnosis not present

## 2023-02-28 DIAGNOSIS — N952 Postmenopausal atrophic vaginitis: Secondary | ICD-10-CM | POA: Diagnosis not present

## 2023-02-28 DIAGNOSIS — Z9889 Other specified postprocedural states: Secondary | ICD-10-CM | POA: Diagnosis not present

## 2023-03-12 DIAGNOSIS — K080 Exfoliation of teeth due to systemic causes: Secondary | ICD-10-CM | POA: Diagnosis not present

## 2023-03-17 ENCOUNTER — Other Ambulatory Visit: Payer: Self-pay | Admitting: Internal Medicine

## 2023-03-17 DIAGNOSIS — Z1231 Encounter for screening mammogram for malignant neoplasm of breast: Secondary | ICD-10-CM

## 2023-03-19 DIAGNOSIS — N1831 Chronic kidney disease, stage 3a: Secondary | ICD-10-CM | POA: Diagnosis not present

## 2023-03-19 DIAGNOSIS — Z79899 Other long term (current) drug therapy: Secondary | ICD-10-CM | POA: Diagnosis not present

## 2023-03-19 DIAGNOSIS — I10 Essential (primary) hypertension: Secondary | ICD-10-CM | POA: Diagnosis not present

## 2023-03-19 DIAGNOSIS — E1129 Type 2 diabetes mellitus with other diabetic kidney complication: Secondary | ICD-10-CM | POA: Diagnosis not present

## 2023-03-19 DIAGNOSIS — E785 Hyperlipidemia, unspecified: Secondary | ICD-10-CM | POA: Diagnosis not present

## 2023-03-26 DIAGNOSIS — N1831 Chronic kidney disease, stage 3a: Secondary | ICD-10-CM | POA: Diagnosis not present

## 2023-03-26 DIAGNOSIS — I10 Essential (primary) hypertension: Secondary | ICD-10-CM | POA: Diagnosis not present

## 2023-03-26 DIAGNOSIS — E1122 Type 2 diabetes mellitus with diabetic chronic kidney disease: Secondary | ICD-10-CM | POA: Diagnosis not present

## 2023-03-27 ENCOUNTER — Ambulatory Visit
Admission: RE | Admit: 2023-03-27 | Discharge: 2023-03-27 | Disposition: A | Discharge status: Home / Self Care | Payer: Medicare Other | Source: Ambulatory Visit | Attending: Internal Medicine | Admitting: Internal Medicine

## 2023-03-27 DIAGNOSIS — Z1231 Encounter for screening mammogram for malignant neoplasm of breast: Secondary | ICD-10-CM

## 2023-07-21 DIAGNOSIS — Z79899 Other long term (current) drug therapy: Secondary | ICD-10-CM | POA: Diagnosis not present

## 2023-07-21 DIAGNOSIS — N1831 Chronic kidney disease, stage 3a: Secondary | ICD-10-CM | POA: Diagnosis not present

## 2023-07-21 DIAGNOSIS — E1129 Type 2 diabetes mellitus with other diabetic kidney complication: Secondary | ICD-10-CM | POA: Diagnosis not present

## 2023-07-21 DIAGNOSIS — I10 Essential (primary) hypertension: Secondary | ICD-10-CM | POA: Diagnosis not present

## 2023-07-22 DIAGNOSIS — K080 Exfoliation of teeth due to systemic causes: Secondary | ICD-10-CM | POA: Diagnosis not present

## 2023-07-28 DIAGNOSIS — E1122 Type 2 diabetes mellitus with diabetic chronic kidney disease: Secondary | ICD-10-CM | POA: Diagnosis not present

## 2023-07-28 DIAGNOSIS — I10 Essential (primary) hypertension: Secondary | ICD-10-CM | POA: Diagnosis not present

## 2023-07-28 DIAGNOSIS — N1831 Chronic kidney disease, stage 3a: Secondary | ICD-10-CM | POA: Diagnosis not present

## 2023-11-18 DIAGNOSIS — N1831 Chronic kidney disease, stage 3a: Secondary | ICD-10-CM | POA: Diagnosis not present

## 2023-11-18 DIAGNOSIS — Z79899 Other long term (current) drug therapy: Secondary | ICD-10-CM | POA: Diagnosis not present

## 2023-11-18 DIAGNOSIS — E1129 Type 2 diabetes mellitus with other diabetic kidney complication: Secondary | ICD-10-CM | POA: Diagnosis not present

## 2023-11-18 DIAGNOSIS — I10 Essential (primary) hypertension: Secondary | ICD-10-CM | POA: Diagnosis not present

## 2023-11-25 DIAGNOSIS — N1831 Chronic kidney disease, stage 3a: Secondary | ICD-10-CM | POA: Diagnosis not present

## 2023-11-25 DIAGNOSIS — I10 Essential (primary) hypertension: Secondary | ICD-10-CM | POA: Diagnosis not present

## 2023-11-25 DIAGNOSIS — E785 Hyperlipidemia, unspecified: Secondary | ICD-10-CM | POA: Diagnosis not present

## 2023-11-25 DIAGNOSIS — E1129 Type 2 diabetes mellitus with other diabetic kidney complication: Secondary | ICD-10-CM | POA: Diagnosis not present

## 2024-01-30 DIAGNOSIS — F330 Major depressive disorder, recurrent, mild: Secondary | ICD-10-CM | POA: Diagnosis not present

## 2024-02-27 DIAGNOSIS — E1100 Type 2 diabetes mellitus with hyperosmolarity without nonketotic hyperglycemic-hyperosmolar coma (NKHHC): Secondary | ICD-10-CM | POA: Diagnosis not present

## 2024-03-08 DIAGNOSIS — K080 Exfoliation of teeth due to systemic causes: Secondary | ICD-10-CM | POA: Diagnosis not present

## 2024-03-25 DIAGNOSIS — Z79899 Other long term (current) drug therapy: Secondary | ICD-10-CM | POA: Diagnosis not present

## 2024-03-25 DIAGNOSIS — I10 Essential (primary) hypertension: Secondary | ICD-10-CM | POA: Diagnosis not present

## 2024-03-25 DIAGNOSIS — N1831 Chronic kidney disease, stage 3a: Secondary | ICD-10-CM | POA: Diagnosis not present

## 2024-03-25 DIAGNOSIS — E1129 Type 2 diabetes mellitus with other diabetic kidney complication: Secondary | ICD-10-CM | POA: Diagnosis not present

## 2024-04-01 DIAGNOSIS — N183 Chronic kidney disease, stage 3 unspecified: Secondary | ICD-10-CM | POA: Diagnosis not present

## 2024-04-01 DIAGNOSIS — E1129 Type 2 diabetes mellitus with other diabetic kidney complication: Secondary | ICD-10-CM | POA: Diagnosis not present

## 2024-04-01 DIAGNOSIS — I10 Essential (primary) hypertension: Secondary | ICD-10-CM | POA: Diagnosis not present
